# Patient Record
Sex: Male | Born: 1960 | Race: White | Hispanic: No | Marital: Married | Smoking: Never smoker
Health system: Southern US, Community
[De-identification: ages and names within clinical notes are randomized; demographics above are authoritative.]

## PROBLEM LIST (undated history)

## (undated) DIAGNOSIS — I10 Essential (primary) hypertension: Secondary | ICD-10-CM

## (undated) DIAGNOSIS — J40 Bronchitis, not specified as acute or chronic: Secondary | ICD-10-CM

## (undated) DIAGNOSIS — Z9889 Other specified postprocedural states: Secondary | ICD-10-CM

## (undated) DIAGNOSIS — I209 Angina pectoris, unspecified: Secondary | ICD-10-CM

## (undated) DIAGNOSIS — N401 Enlarged prostate with lower urinary tract symptoms: Secondary | ICD-10-CM

## (undated) DIAGNOSIS — G4730 Sleep apnea, unspecified: Secondary | ICD-10-CM

## (undated) DIAGNOSIS — Z8719 Personal history of other diseases of the digestive system: Secondary | ICD-10-CM

## (undated) DIAGNOSIS — E669 Obesity, unspecified: Secondary | ICD-10-CM

## (undated) DIAGNOSIS — J189 Pneumonia, unspecified organism: Secondary | ICD-10-CM

## (undated) DIAGNOSIS — N529 Male erectile dysfunction, unspecified: Secondary | ICD-10-CM

## (undated) DIAGNOSIS — K219 Gastro-esophageal reflux disease without esophagitis: Secondary | ICD-10-CM

## (undated) DIAGNOSIS — E785 Hyperlipidemia, unspecified: Secondary | ICD-10-CM

## (undated) DIAGNOSIS — L0291 Cutaneous abscess, unspecified: Secondary | ICD-10-CM

## (undated) DIAGNOSIS — IMO0001 Reserved for inherently not codable concepts without codable children: Secondary | ICD-10-CM

## (undated) DIAGNOSIS — M1990 Unspecified osteoarthritis, unspecified site: Secondary | ICD-10-CM

## (undated) DIAGNOSIS — N451 Epididymitis: Secondary | ICD-10-CM

## (undated) DIAGNOSIS — E119 Type 2 diabetes mellitus without complications: Secondary | ICD-10-CM

## (undated) DIAGNOSIS — Z8614 Personal history of Methicillin resistant Staphylococcus aureus infection: Secondary | ICD-10-CM

## (undated) DIAGNOSIS — K5900 Constipation, unspecified: Secondary | ICD-10-CM

## (undated) DIAGNOSIS — T8859XA Other complications of anesthesia, initial encounter: Secondary | ICD-10-CM

## (undated) DIAGNOSIS — R197 Diarrhea, unspecified: Secondary | ICD-10-CM

## (undated) DIAGNOSIS — R112 Nausea with vomiting, unspecified: Secondary | ICD-10-CM

## (undated) DIAGNOSIS — T4145XA Adverse effect of unspecified anesthetic, initial encounter: Secondary | ICD-10-CM

## (undated) HISTORY — DX: Obesity, unspecified: E66.9

## (undated) HISTORY — DX: Hyperlipidemia, unspecified: E78.5

## (undated) HISTORY — DX: Male erectile dysfunction, unspecified: N52.9

## (undated) HISTORY — DX: Type 2 diabetes mellitus without complications: E11.9

## (undated) HISTORY — DX: Essential (primary) hypertension: I10

## (undated) HISTORY — PX: PILONIDAL CYST EXCISION: SHX744

## (undated) HISTORY — DX: Cutaneous abscess, unspecified: L02.91

## (undated) HISTORY — PX: COLONOSCOPY: SHX174

## (undated) HISTORY — PX: INCISION AND DRAINAGE OF WOUND: SHX1803

---
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
## 2001-09-28 HISTORY — PX: FINGER FRACTURE SURGERY: SHX638

## 2001-10-01 ENCOUNTER — Encounter: Admission: RE | Admit: 2001-10-01 | Discharge: 2001-10-01 | Payer: Self-pay | Admitting: Occupational Medicine

## 2001-10-01 ENCOUNTER — Encounter: Payer: Self-pay | Admitting: Occupational Medicine

## 2003-07-17 HISTORY — PX: HERNIA REPAIR: SHX51

## 2004-01-26 ENCOUNTER — Ambulatory Visit (HOSPITAL_COMMUNITY): Admission: RE | Admit: 2004-01-26 | Discharge: 2004-01-26 | Payer: Self-pay | Admitting: General Surgery

## 2004-01-26 ENCOUNTER — Ambulatory Visit (HOSPITAL_BASED_OUTPATIENT_CLINIC_OR_DEPARTMENT_OTHER): Admission: RE | Admit: 2004-01-26 | Discharge: 2004-01-26 | Payer: Self-pay | Admitting: General Surgery

## 2004-01-26 HISTORY — PX: UMBILICAL HERNIA REPAIR: SHX196

## 2008-09-07 ENCOUNTER — Encounter (INDEPENDENT_AMBULATORY_CARE_PROVIDER_SITE_OTHER): Payer: Self-pay | Admitting: *Deleted

## 2008-09-07 ENCOUNTER — Ambulatory Visit (HOSPITAL_COMMUNITY): Admission: RE | Admit: 2008-09-07 | Discharge: 2008-09-07 | Payer: Self-pay | Admitting: *Deleted

## 2008-09-07 HISTORY — PX: STAPLE HEMORRHOIDECTOMY: SHX2438

## 2010-10-31 LAB — DIFFERENTIAL
Basophils Absolute: 0 10*3/uL (ref 0.0–0.1)
Basophils Relative: 0 % (ref 0–1)
Eosinophils Absolute: 0.1 10*3/uL (ref 0.0–0.7)
Eosinophils Relative: 1 % (ref 0–5)
Lymphocytes Relative: 30 % (ref 12–46)
Lymphs Abs: 3 10*3/uL (ref 0.7–4.0)
Monocytes Absolute: 0.6 10*3/uL (ref 0.1–1.0)
Monocytes Relative: 6 % (ref 3–12)
Neutro Abs: 6.3 10*3/uL (ref 1.7–7.7)
Neutrophils Relative %: 63 % (ref 43–77)

## 2010-10-31 LAB — BASIC METABOLIC PANEL
BUN: 9 mg/dL (ref 6–23)
CO2: 27 mEq/L (ref 19–32)
Calcium: 8.6 mg/dL (ref 8.4–10.5)
Chloride: 103 mEq/L (ref 96–112)
Creatinine, Ser: 0.82 mg/dL (ref 0.4–1.5)
GFR calc Af Amer: >60 mL/min (ref >60)
GFR calc non Af Amer: >60 mL/min (ref >60)
Glucose, Bld: 112 mg/dL — ABNORMAL HIGH (ref 70–99)
Potassium: 3.4 mEq/L — ABNORMAL LOW (ref 3.5–5.1)
Sodium: 139 mEq/L (ref 135–145)

## 2010-10-31 LAB — CBC
HCT: 44 % (ref 39.0–52.0)
Hemoglobin: 14.5 g/dL (ref 13.0–17.0)
MCHC: 32.9 g/dL (ref 30.0–36.0)
MCV: 86.6 fL (ref 78.0–100.0)
Platelets: 252 10*3/uL (ref 150–400)
RBC: 5.08 MIL/uL (ref 4.22–5.81)
RDW: 13.3 % (ref 11.5–15.5)
WBC: 10 10*3/uL (ref 4.0–10.5)

## 2010-11-28 NOTE — Op Note (Signed)
NAME:  Terry Wolfe, Terry Wolfe NO.:  [id number]   MEDICAL RECORD NO.:  [id number]          PATIENT TYPE:  AMB   LOCATION:  DAY                          FACILITY:  [facility]   PHYSICIAN:  Alfonse Ras, MD   DATE OF BIRTH:  08-Apr-1961   DATE OF PROCEDURE:  DATE OF DISCHARGE:                               OPERATIVE REPORT   ANESTHESIA:  General.   PREOPERATIVE DIAGNOSIS:  Grade 3 internal hemorrhoids with prolapse.   POSTOPERATIVE DIAGNOSIS:  Grade 3 internal hemorrhoids with prolapse.   PROCEDURE:  PPH hemorrhoidectomy.   DESCRIPTION:  After extensive informed consent was granted both in the  office and in the preoperative holding area, the patient was taken to  the operating room and placed in the supine position.  After adequate  general anesthesia with endotracheal tube, and then placed in the prone  jack-knife position.  Perianal and rectal prep were undertaken with  Betadine solution.  Digital rectal dilatation was accomplished to three  fingerbreadths.  Internal hemorrhoidal bundles which were quite inflamed  were injected with 0.5 Marcaine with Wydase.  The internal-external  sphincter muscles were injected with additional 0.5 Marcaine.  A 2-0  Prolene suture was placed in a pursestring fashion approximately 5 cm  proximal to the dentate line with mucosa and submucosa.  Stapler was  then introduced.  The pursestring was tied down around the anvil and was  closed and held in place for 45 seconds.  It was fired, released, and  removed.  There was a good donut of tissue with no evidence of  muscularis.  Staple line was inspected.  There was no bleeding.  Gelfoam  packing was placed.  The patient tolerated the procedure well and went  to PACU in good condition.      Alfonse Ras, MD  Electronically Signed     KRE/MEDQ  D:  09/07/2008  T:  09/07/2008  Job:  [id number]

## 2010-12-01 NOTE — Op Note (Signed)
NAME:  Terry Wolfe, Terry Wolfe NO.:  [id number]   MEDICAL RECORD NO.:  [id number]                   PATIENT TYPE:  AMB   LOCATION:  DSC                                  FACILITY:  [facility]   PHYSICIAN:  Gita Kudo, M.D.              DATE OF BIRTH:  September 11, 1960   DATE OF PROCEDURE:  01/26/2004  DATE OF DISCHARGE:                                 OPERATIVE REPORT   PROCEDURE:  Repair of umbilical hernia with Ventralex mesh - 2.5 inch  circle.   SURGEON:  Gita Kudo, M.D.   ANESTHESIA:  General.   PREOPERATIVE DIAGNOSIS:  Umbilical hernia.   POSTOPERATIVE DIAGNOSIS:  Umbilical hernia.   INDICATIONS FOR PROCEDURE:  This 50 year old heavy male with a bulge at his  umbilicus.  It worsened while he was taking care of his invalid wife, and  now he comes in for an elective surgery.   FINDINGS:  The patient had a thin walled hernia sac that had pre-peritoneal  fat, as well as omentum in it.  The fascia defect was about 2.0 cm in size.   DESCRIPTION OF PROCEDURE:  Under satisfactory general endotracheal  anesthesia having received 1.0 g Ancef preoperatively, the patient was  positioned, prepped and draped in a standard fashion.  A total of 30 mL of  0.25% Marcaine with epinephrine was infiltrated during the procedure for  postoperative analgesia.  A curved incision was made above the umbilicus and  carried down to the abdominal wall.  The hernia sac was dissected off the  umbilical skin, and then the fascial/hernia sac junction was circumscribed  using cautery.  The sac was then opened because it was so redundant and  excess cut away.  Finger dissection showed that we were in the peritoneum  and that there was good margin all around for mesh adherence.  Then the  circular portion of the mesh was prepared.  Sutures were placed at 12, 3 and  6 o'clock.  They were placed down through the abdominal wall and the  peritoneum through the polypropylene part of the  mesh and then back up  through.  The mesh was then placed into the defect, and the sutures brought  up and the mesh lay closely against the undersurface of the abdominal wall.  Then the last suture at the 9 o'clock position was placed from the opposite  side of the table in a similar fashion and tied.  In this manner the mesh  was in excellent position.  Then the defect was closed in a transverse direction with figure-of-eight #0  Prolene sutures x3, taking bites of the tabs of the mesh, to ensure good  adherence.  Then the wound was lavaged with saline, closed in layers with #2-  0 and  #3-0 Vicryl, followed by Steri-Strips for skin, and two mattress sutures of  #3-0 Vicryl.  A sterile absorbent dressing was  then applied.  The patient went to the recovery room from the operating room in good  condition, without complications.                                               Gita Kudo, M.D.    MRL/MEDQ  D:  01/26/2004  T:  01/26/2004  Job:  [id number]   cc:   Donia Guiles, M.D.  [address] [place]  [place] [address]  Fax: [phone]

## 2011-03-17 HISTORY — PX: INCISE AND DRAIN ABCESS: PRO64

## 2011-04-04 ENCOUNTER — Encounter (INDEPENDENT_AMBULATORY_CARE_PROVIDER_SITE_OTHER): Payer: Self-pay | Admitting: Surgery

## 2011-04-04 ENCOUNTER — Ambulatory Visit (INDEPENDENT_AMBULATORY_CARE_PROVIDER_SITE_OTHER): Payer: BC Managed Care – PPO | Admitting: Surgery

## 2011-04-04 VITALS — BP 148/88 | HR 72 | Temp 98.2°F | Resp 20 | Ht 71.0 in | Wt 276.2 lb

## 2011-04-04 DIAGNOSIS — L02419 Cutaneous abscess of limb, unspecified: Secondary | ICD-10-CM

## 2011-04-04 NOTE — Patient Instructions (Signed)
Change the outside dressing as needed but try to leave the packing in place. I will lead to see you on Friday to followup and will change the dressing then. If you think you are any worse tomorrow, call the office so we can follow you up. As we discussed, I think this is a MRSA infection and these can sometimes get much worse very quickly.

## 2011-04-04 NOTE — Progress Notes (Signed)
Chief complaint: Thigh abscess  History of present illness: The patient presents from his primary physician, Dr. Clelia Croft, with a left thigh abscess. He presented to to that office on September 17 with a four-day history of left thigh swelling worsening in size. Of note was that his father was recently treated for MRSA. At that time and I and D. was done and culture sent. There is no result of that culture and apparently a second one was sent today  The patient returned for followup today. He notes that he felt better on the lower half of his thigh area where the infection was after the I&D but the upper portion did not improve much. He has had no fevers or flulike symptoms. T. his physician it did not look that this did not in any better. He felt that it might be better with a bigger drainage and asked for surgical consultation.  Past history, family history, and review of systems are all in our Epic chart and not redictated here.  Physical exam: Vital signs noted in Epic. He is not febrile.  General: The patient alert and in no distress. He appears comfortable. Thigh: There is an open area on the lower anterior left thigh about 1.5 cm in length. There is some purulent material present. It is approximately 5 or 6 cm area of induration at about 12 cm area of erythema. There appears to be some fluctuance proximal to the I&D site.  Impression: I think this is a MRSA infection. I think some additional incision and drainage will be helpful although this might need to be done subsequently in the operating room if he does not show rapid improvement.  Plan I discussed the situation with the patient and he was agreeable to have [place] open this up more under local anesthesia in the office.  Procedure note: The anterior thighs prepped with Betadine. I anesthetized it with about 10 cc of Xylocaine with epi getting a fairly wide long area anesthetized. I then extended the prior incision proximally for about 6 cm. I  took a 1 cm wide area scan to be sure this stayed widely opened. I probed deeper to see if there were any pockets further in and couldn't identify any although there is a moderate amount of induration of the tissues. I couldn't probe any deeper because of inability to get anesthesia into these deeper tissues.  He tolerated this well. I packed it with some 4 x 4's. I'll plan to follow him up here on Friday, two days from now. I told him if this gets any worse or he develops any systemic symptoms then he needs to call his back or go to the emergency department. Although I think this local I&D will suffice to get some improvement, I told him that sometimes these progress and need to be treated with aggressive drainage in the operating room and intravenous antibiotics.  A repeat culture as noted was sent already by his primary physicians so I did not send another one today. He is started on doxycycline.

## 2011-04-06 ENCOUNTER — Ambulatory Visit (INDEPENDENT_AMBULATORY_CARE_PROVIDER_SITE_OTHER): Payer: BC Managed Care – PPO | Admitting: Surgery

## 2011-04-06 ENCOUNTER — Encounter (INDEPENDENT_AMBULATORY_CARE_PROVIDER_SITE_OTHER): Payer: Self-pay | Admitting: Surgery

## 2011-04-06 ENCOUNTER — Telehealth (INDEPENDENT_AMBULATORY_CARE_PROVIDER_SITE_OTHER): Payer: Self-pay | Admitting: General Surgery

## 2011-04-06 VITALS — BP 168/100 | HR 64 | Temp 97.6°F | Resp 12 | Ht 71.0 in | Wt 278.6 lb

## 2011-04-06 DIAGNOSIS — L02419 Cutaneous abscess of limb, unspecified: Secondary | ICD-10-CM

## 2011-04-06 DIAGNOSIS — L03119 Cellulitis of unspecified part of limb: Secondary | ICD-10-CM

## 2011-04-06 NOTE — Progress Notes (Signed)
Chief complaint: Followup after abscess drainage  History of present illness: He presented to my office two days ago with a abscess in the left anterior thigh which had been previously drained. However it was not improving and we opened it additionally on Wednesday.  He feels that he is improving. He is having less pain. There has not been much drainage. He has not had fever.  Exam: The wound is clean. There is markedly less induration and erythema. It is still tender.  Impression: Improving after drainage of thigh abscess likely a MRSA infection  Plan: He will continue his antibiotics. He will start to shower and repack with sterile gauze dressings. We'll follow up here next week.

## 2011-04-06 NOTE — Patient Instructions (Signed)
Tomorrow you may remove the dressing and shower. Then it needs to be dressed with some sterile gauze. You should shower and change dressing like this daily.  I will be to recheck you in the office next week. If you believe you're getting any worse she need to let us know and be seen in the emergency room over the weekend.

## 2011-04-06 NOTE — Telephone Encounter (Signed)
Patient out of town starting next Thursday, appt made for reck with Dr Jamey Ripa 04/11/11 @ 5:00 to arrive at 4:45pm.

## 2011-04-11 ENCOUNTER — Ambulatory Visit (INDEPENDENT_AMBULATORY_CARE_PROVIDER_SITE_OTHER): Payer: BC Managed Care – PPO | Admitting: Surgery

## 2011-04-11 ENCOUNTER — Encounter (INDEPENDENT_AMBULATORY_CARE_PROVIDER_SITE_OTHER): Payer: Self-pay | Admitting: Surgery

## 2011-04-11 VITALS — BP 130/88 | HR 80 | Temp 98.4°F | Resp 16 | Ht 71.0 in | Wt 282.2 lb

## 2011-04-11 DIAGNOSIS — L03119 Cellulitis of unspecified part of limb: Secondary | ICD-10-CM

## 2011-04-11 DIAGNOSIS — L02419 Cutaneous abscess of limb, unspecified: Secondary | ICD-10-CM

## 2011-04-11 MED ORDER — DOXYCYCLINE HYCLATE 100 MG PO CAPS: 100.0000 mg | ORAL_CAPSULE | Freq: Two times a day (BID) | ORAL | Status: DC

## 2011-04-11 NOTE — Progress Notes (Signed)
Chief complaint: Followup after abscess drainage  History of present illness: He had a partially opened abscess opened more extensively about 10 days ago. He has been following up here intermittently. Currently he has been showering but leaving the packing in the abscess cavity on the left thigh. He feels that he is improving. He is having less pain. There has not been much drainage. He has not had fever.  Exam: The wound is clean. There is markedly less induration and erythema. It is still tender but minimal  Impression: Improving after drainage of thigh abscess likely a MRSA infection  Plan: He will continue his antibiotics. He will start to shower and repack with sterile gauze dressings. We'll follow up here next week.

## 2011-04-11 NOTE — Patient Instructions (Signed)
Starting today or tomorrow, when you shower remove the packing. It should be gently replaced with a note her 2 x 2 gauze and then covered as you have been doing.  Continue your antibiotics for another week or 10 days. We need to have someone check the area in the office next week so we will make an appointment for that.

## 2011-04-12 ENCOUNTER — Encounter (INDEPENDENT_AMBULATORY_CARE_PROVIDER_SITE_OTHER): Payer: BC Managed Care – PPO | Admitting: Surgery

## 2011-04-17 ENCOUNTER — Encounter (INDEPENDENT_AMBULATORY_CARE_PROVIDER_SITE_OTHER): Payer: Self-pay | Admitting: Surgery

## 2011-04-19 ENCOUNTER — Ambulatory Visit (INDEPENDENT_AMBULATORY_CARE_PROVIDER_SITE_OTHER): Payer: BC Managed Care – PPO | Admitting: Surgery

## 2011-04-19 ENCOUNTER — Encounter (INDEPENDENT_AMBULATORY_CARE_PROVIDER_SITE_OTHER): Payer: Self-pay | Admitting: Surgery

## 2011-04-19 VITALS — BP 168/92 | HR 64 | Temp 97.7°F | Resp 20 | Ht 71.0 in | Wt 284.5 lb

## 2011-04-19 DIAGNOSIS — L02419 Cutaneous abscess of limb, unspecified: Secondary | ICD-10-CM

## 2011-04-19 NOTE — Progress Notes (Signed)
Chief complaint: Followup after abscess drainage  History of present illness: He had a partially opened abscess opened more extensively about 10 days ago. He has been following up here intermittently. Currently he has been showering but leaving the packing in the abscess cavity on the left thigh. He feels that he is improving. He is having less pain. There has not been much drainage. He has not had fever.  Exam: The wound is clean. There is markedly less induration and erythema. It is still tender but minimal  Impression: Improving after drainage of thigh abscess likely a MRSA infection  Plan: He will continue his antibiotics. He will start to shower and repack with sterile gauze dressings. We'll follow up here next week.   04/19/11 The wound is almost completely healed.  2x1.5 cm area of granulation tissue flush with the skin.  Will switch to Neosporin and dry gauze until completely healed.  Follow-up PRn

## 2011-04-19 NOTE — Patient Instructions (Signed)
Dress the wound with Neosporin and a dry dressing.  Change daily after showers.  Call us if any further problems [phone]

## 2011-11-15 ENCOUNTER — Other Ambulatory Visit (HOSPITAL_COMMUNITY): Payer: Self-pay | Admitting: Orthopaedic Surgery

## 2011-11-15 ENCOUNTER — Encounter (HOSPITAL_COMMUNITY)
Admission: AD | Disposition: A | Discharge status: Home / Self Care | Payer: Self-pay | Source: Ambulatory Visit | Attending: Orthopaedic Surgery

## 2011-11-15 ENCOUNTER — Ambulatory Visit (HOSPITAL_COMMUNITY)
Admission: AD | Admit: 2011-11-15 | Discharge: 2011-11-16 | Disposition: A | Discharge status: Home / Self Care | Payer: Worker's Compensation | Source: Ambulatory Visit | Attending: Orthopaedic Surgery | Admitting: Orthopaedic Surgery

## 2011-11-15 ENCOUNTER — Encounter (HOSPITAL_COMMUNITY): Payer: Self-pay | Admitting: *Deleted

## 2011-11-15 ENCOUNTER — Ambulatory Visit (HOSPITAL_COMMUNITY): Payer: Worker's Compensation | Admitting: Certified Registered Nurse Anesthetist

## 2011-11-15 ENCOUNTER — Ambulatory Visit (HOSPITAL_COMMUNITY): Payer: Worker's Compensation

## 2011-11-15 ENCOUNTER — Encounter (HOSPITAL_COMMUNITY): Payer: Self-pay | Admitting: Certified Registered Nurse Anesthetist

## 2011-11-15 DIAGNOSIS — E669 Obesity, unspecified: Secondary | ICD-10-CM | POA: Insufficient documentation

## 2011-11-15 DIAGNOSIS — L02512 Cutaneous abscess of left hand: Secondary | ICD-10-CM

## 2011-11-15 DIAGNOSIS — E785 Hyperlipidemia, unspecified: Secondary | ICD-10-CM | POA: Insufficient documentation

## 2011-11-15 DIAGNOSIS — I10 Essential (primary) hypertension: Secondary | ICD-10-CM | POA: Insufficient documentation

## 2011-11-15 DIAGNOSIS — F329 Major depressive disorder, single episode, unspecified: Secondary | ICD-10-CM | POA: Insufficient documentation

## 2011-11-15 DIAGNOSIS — F3289 Other specified depressive episodes: Secondary | ICD-10-CM | POA: Insufficient documentation

## 2011-11-15 DIAGNOSIS — IMO0002 Reserved for concepts with insufficient information to code with codable children: Secondary | ICD-10-CM | POA: Insufficient documentation

## 2011-11-15 DIAGNOSIS — L03012 Cellulitis of left finger: Secondary | ICD-10-CM

## 2011-11-15 HISTORY — DX: Benign prostatic hyperplasia with lower urinary tract symptoms: N40.1

## 2011-11-15 HISTORY — DX: Personal history of other diseases of the digestive system: Z87.19

## 2011-11-15 HISTORY — DX: Pneumonia, unspecified organism: J18.9

## 2011-11-15 HISTORY — DX: Angina pectoris, unspecified: I20.9

## 2011-11-15 HISTORY — DX: Gastro-esophageal reflux disease without esophagitis: K21.9

## 2011-11-15 HISTORY — DX: Personal history of Methicillin resistant Staphylococcus aureus infection: Z86.14

## 2011-11-15 HISTORY — DX: Epididymitis: N45.1

## 2011-11-15 HISTORY — PX: I & D EXTREMITY: SHX5045

## 2011-11-15 HISTORY — DX: Unspecified osteoarthritis, unspecified site: M19.90

## 2011-11-15 LAB — BASIC METABOLIC PANEL
BUN: 13 mg/dL (ref 6–23)
Calcium: 9.4 mg/dL (ref 8.4–10.5)
GFR calc Af Amer: >90 mL/min (ref >90)
GFR calc non Af Amer: >90 mL/min (ref >90)
Glucose, Bld: 80 mg/dL (ref 70–99)
Potassium: 3.4 mEq/L — ABNORMAL LOW (ref 3.5–5.1)
Sodium: 140 mEq/L (ref 135–145)

## 2011-11-15 LAB — CBC
Hemoglobin: 14.2 g/dL (ref 13.0–17.0)
MCH: 28.2 pg (ref 26.0–34.0)
MCHC: 33.6 g/dL (ref 30.0–36.0)
RDW: 13.3 % (ref 11.5–15.5)

## 2011-11-15 LAB — SURGICAL PCR SCREEN
MRSA, PCR: POSITIVE — AB
Staphylococcus aureus: POSITIVE — AB

## 2011-11-15 SURGERY — IRRIGATION AND DEBRIDEMENT EXTREMITY
Anesthesia: General | Site: Thumb | Laterality: Left | Wound class: Dirty or Infected

## 2011-11-15 MED ORDER — CLINDAMYCIN PHOSPHATE 600 MG/50ML IV SOLN
600.0000 mg | INTRAVENOUS | Status: AC
  Administered 2011-11-15: 600 mg via INTRAVENOUS
  Filled 2011-11-15: qty 50

## 2011-11-15 MED ORDER — MORPHINE SULFATE 4 MG/ML IJ SOLN: 0.0500 mg/kg | INTRAMUSCULAR | Status: DC | PRN

## 2011-11-15 MED ORDER — ONDANSETRON HCL 4 MG/2ML IJ SOLN: 4.0000 mg | Freq: Once | INTRAMUSCULAR | Status: DC | PRN

## 2011-11-15 MED ORDER — ONDANSETRON HCL 4 MG PO TABS: 4.0000 mg | ORAL_TABLET | Freq: Four times a day (QID) | ORAL | Status: DC | PRN

## 2011-11-15 MED ORDER — HYDROMORPHONE HCL PF 1 MG/ML IJ SOLN
INTRAMUSCULAR | Status: AC
  Administered 2011-11-15: 0.5 mg via INTRAVENOUS
  Filled 2011-11-15: qty 1

## 2011-11-15 MED ORDER — ASPIRIN EC 81 MG PO TBEC
81.0000 mg | DELAYED_RELEASE_TABLET | Freq: Every day | ORAL | Status: DC
  Administered 2011-11-16: 81 mg via ORAL
  Filled 2011-11-15: qty 1

## 2011-11-15 MED ORDER — SODIUM CHLORIDE 0.9 % IR SOLN
Status: DC | PRN
  Administered 2011-11-15: 1000 mL

## 2011-11-15 MED ORDER — ONDANSETRON HCL 4 MG/2ML IJ SOLN: 4.0000 mg | Freq: Four times a day (QID) | INTRAMUSCULAR | Status: DC | PRN

## 2011-11-15 MED ORDER — HYDROMORPHONE HCL PF 1 MG/ML IJ SOLN: 0.2500 mg | INTRAMUSCULAR | Status: DC | PRN

## 2011-11-15 MED ORDER — ALBUTEROL SULFATE (5 MG/ML) 0.5% IN NEBU
2.5000 mg | INHALATION_SOLUTION | Freq: Once | RESPIRATORY_TRACT | Status: AC
  Administered 2011-11-15: 2.5 mg via RESPIRATORY_TRACT

## 2011-11-15 MED ORDER — METOCLOPRAMIDE HCL 5 MG/ML IJ SOLN
5.0000 mg | Freq: Three times a day (TID) | INTRAMUSCULAR | Status: DC | PRN
  Filled 2011-11-15: qty 2

## 2011-11-15 MED ORDER — LISINOPRIL 20 MG PO TABS
20.0000 mg | ORAL_TABLET | Freq: Every day | ORAL | Status: DC
  Administered 2011-11-16: 20 mg via ORAL
  Filled 2011-11-15: qty 1

## 2011-11-15 MED ORDER — HYDROMORPHONE HCL PF 1 MG/ML IJ SOLN
0.2500 mg | INTRAMUSCULAR | Status: DC | PRN
  Administered 2011-11-15 (×4): 0.5 mg via INTRAVENOUS

## 2011-11-15 MED ORDER — METHOCARBAMOL 500 MG PO TABS
500.0000 mg | ORAL_TABLET | Freq: Four times a day (QID) | ORAL | Status: DC | PRN
  Administered 2011-11-16: 500 mg via ORAL
  Filled 2011-11-15: qty 1

## 2011-11-15 MED ORDER — MIDAZOLAM HCL 5 MG/5ML IJ SOLN
INTRAMUSCULAR | Status: DC | PRN
  Administered 2011-11-15: 2 mg via INTRAVENOUS

## 2011-11-15 MED ORDER — AMLODIPINE BESYLATE 10 MG PO TABS
10.0000 mg | ORAL_TABLET | Freq: Every day | ORAL | Status: DC
  Administered 2011-11-16: 10 mg via ORAL
  Filled 2011-11-15: qty 1

## 2011-11-15 MED ORDER — LACTATED RINGERS IV SOLN: INTRAVENOUS | Status: DC

## 2011-11-15 MED ORDER — LIDOCAINE HCL (CARDIAC) 20 MG/ML IV SOLN
INTRAVENOUS | Status: DC | PRN
  Administered 2011-11-15: 60 mg via INTRAVENOUS

## 2011-11-15 MED ORDER — ONDANSETRON HCL 4 MG/2ML IJ SOLN
INTRAMUSCULAR | Status: DC | PRN
  Administered 2011-11-15: 4 mg via INTRAVENOUS

## 2011-11-15 MED ORDER — ADULT MULTIVITAMIN W/MINERALS CH
1.0000 | ORAL_TABLET | Freq: Every day | ORAL | Status: DC
  Administered 2011-11-16: 1 via ORAL
  Filled 2011-11-15: qty 1

## 2011-11-15 MED ORDER — CLINDAMYCIN PHOSPHATE 600 MG/50ML IV SOLN
600.0000 mg | Freq: Four times a day (QID) | INTRAVENOUS | Status: DC
  Administered 2011-11-16 (×2): 600 mg via INTRAVENOUS
  Filled 2011-11-15 (×3): qty 50

## 2011-11-15 MED ORDER — METOCLOPRAMIDE HCL 5 MG PO TABS
5.0000 mg | ORAL_TABLET | Freq: Three times a day (TID) | ORAL | Status: DC | PRN
  Filled 2011-11-15: qty 2

## 2011-11-15 MED ORDER — PROPOFOL 10 MG/ML IV EMUL
INTRAVENOUS | Status: DC | PRN
  Administered 2011-11-15: 200 mg via INTRAVENOUS

## 2011-11-15 MED ORDER — HYDROCODONE-ACETAMINOPHEN 5-325 MG PO TABS
1.0000 | ORAL_TABLET | ORAL | Status: DC | PRN
  Administered 2011-11-16: 2 via ORAL
  Filled 2011-11-15: qty 2

## 2011-11-15 MED ORDER — ATORVASTATIN CALCIUM 20 MG PO TABS
20.0000 mg | ORAL_TABLET | Freq: Every day | ORAL | Status: DC
  Filled 2011-11-15: qty 1

## 2011-11-15 MED ORDER — BUPIVACAINE HCL 0.25 % IJ SOLN
INTRAMUSCULAR | Status: DC | PRN
  Administered 2011-11-15: 5 mL

## 2011-11-15 MED ORDER — OXYCODONE HCL 5 MG PO TABS
5.0000 mg | ORAL_TABLET | ORAL | Status: DC | PRN
  Administered 2011-11-16: 10 mg via ORAL
  Filled 2011-11-15: qty 2

## 2011-11-15 MED ORDER — MUPIROCIN 2 % EX OINT
TOPICAL_OINTMENT | Freq: Two times a day (BID) | CUTANEOUS | Status: DC
  Filled 2011-11-15 (×2): qty 22

## 2011-11-15 MED ORDER — METHOCARBAMOL 100 MG/ML IJ SOLN: 500.0000 mg | Freq: Four times a day (QID) | INTRAVENOUS | Status: DC | PRN

## 2011-11-15 MED ORDER — SODIUM CHLORIDE 0.9 % IV SOLN
INTRAVENOUS | Status: DC
  Administered 2011-11-16: 02:00:00 via INTRAVENOUS

## 2011-11-15 MED ORDER — ZOLPIDEM TARTRATE 5 MG PO TABS: 5.0000 mg | ORAL_TABLET | Freq: Every evening | ORAL | Status: DC | PRN

## 2011-11-15 MED ORDER — LACTATED RINGERS IV SOLN
INTRAVENOUS | Status: DC | PRN
  Administered 2011-11-15: 18:00:00 via INTRAVENOUS

## 2011-11-15 MED ORDER — FENTANYL CITRATE 0.05 MG/ML IJ SOLN
INTRAMUSCULAR | Status: DC | PRN
  Administered 2011-11-15 (×2): 25 ug via INTRAVENOUS
  Administered 2011-11-15: 50 ug via INTRAVENOUS

## 2011-11-15 MED ORDER — DIPHENHYDRAMINE HCL 12.5 MG/5ML PO ELIX
12.5000 mg | ORAL_SOLUTION | ORAL | Status: DC | PRN
  Filled 2011-11-15: qty 10

## 2011-11-15 MED ORDER — MORPHINE SULFATE 2 MG/ML IJ SOLN
1.0000 mg | INTRAMUSCULAR | Status: DC | PRN
  Administered 2011-11-16: 1 mg via INTRAVENOUS
  Filled 2011-11-15: qty 1

## 2011-11-15 SURGICAL SUPPLY — 55 items
BANDAGE CONFORM 2  STR LF (GAUZE/BANDAGES/DRESSINGS) ×2 IMPLANT
BANDAGE CONFORM 3  STR LF (GAUZE/BANDAGES/DRESSINGS) ×2 IMPLANT
BANDAGE ELASTIC 3 VELCRO ST LF (GAUZE/BANDAGES/DRESSINGS) IMPLANT
BLADE SURG 10 STRL SS (BLADE) ×2 IMPLANT
BNDG COHESIVE 1X5 TAN STRL LF (GAUZE/BANDAGES/DRESSINGS) ×2 IMPLANT
BNDG COHESIVE 4X5 TAN STRL (GAUZE/BANDAGES/DRESSINGS) ×2 IMPLANT
BNDG COHESIVE 6X5 TAN STRL LF (GAUZE/BANDAGES/DRESSINGS) ×4 IMPLANT
BNDG GAUZE STRTCH 6 (GAUZE/BANDAGES/DRESSINGS) ×6 IMPLANT
CLOTH BEACON ORANGE TIMEOUT ST (SAFETY) ×2 IMPLANT
CORDS BIPOLAR (ELECTRODE) IMPLANT
COVER SURGICAL LIGHT HANDLE (MISCELLANEOUS) ×2 IMPLANT
CUFF TOURNIQUET SINGLE 18IN (TOURNIQUET CUFF) ×2 IMPLANT
CUFF TOURNIQUET SINGLE 24IN (TOURNIQUET CUFF) IMPLANT
CUFF TOURNIQUET SINGLE 34IN LL (TOURNIQUET CUFF) IMPLANT
CUFF TOURNIQUET SINGLE 44IN (TOURNIQUET CUFF) IMPLANT
DRAPE ORTHO SPLIT 77X108 STRL (DRAPES)
DRAPE SURG 17X23 STRL (DRAPES) ×2 IMPLANT
DRAPE SURG ORHT 6 SPLT 77X108 (DRAPES) IMPLANT
DRAPE U-SHAPE 47X51 STRL (DRAPES) IMPLANT
DURAPREP 26ML APPLICATOR (WOUND CARE) ×2 IMPLANT
ELECT CAUTERY BLADE 6.4 (BLADE) ×2 IMPLANT
ELECT REM PT RETURN 9FT ADLT (ELECTROSURGICAL) ×2
ELECTRODE REM PT RTRN 9FT ADLT (ELECTROSURGICAL) ×1 IMPLANT
GAUZE XEROFORM 1X8 LF (GAUZE/BANDAGES/DRESSINGS) ×2 IMPLANT
GLOVE BIOGEL PI IND STRL 8 (GLOVE) ×2 IMPLANT
GLOVE BIOGEL PI INDICATOR 8 (GLOVE) ×2
GLOVE ORTHO TXT STRL SZ7.5 (GLOVE) ×2 IMPLANT
GOWN PREVENTION PLUS LG XLONG (DISPOSABLE) ×2 IMPLANT
GOWN PREVENTION PLUS XLARGE (GOWN DISPOSABLE) ×2 IMPLANT
GOWN STRL NON-REIN LRG LVL3 (GOWN DISPOSABLE) ×2 IMPLANT
HANDPIECE INTERPULSE COAX TIP (DISPOSABLE)
KIT BASIN OR (CUSTOM PROCEDURE TRAY) ×2 IMPLANT
KIT ROOM TURNOVER OR (KITS) ×2 IMPLANT
MANIFOLD NEPTUNE II (INSTRUMENTS) ×2 IMPLANT
NS IRRIG 1000ML POUR BTL (IV SOLUTION) ×2 IMPLANT
PACK ORTHO EXTREMITY (CUSTOM PROCEDURE TRAY) ×2 IMPLANT
PAD ARMBOARD 7.5X6 YLW CONV (MISCELLANEOUS) ×4 IMPLANT
PADDING CAST ABS 4INX4YD NS (CAST SUPPLIES) ×2
PADDING CAST ABS COTTON 4X4 ST (CAST SUPPLIES) ×2 IMPLANT
PADDING CAST COTTON 6X4 STRL (CAST SUPPLIES) ×2 IMPLANT
SET HNDPC FAN SPRY TIP SCT (DISPOSABLE) IMPLANT
SPONGE GAUZE 4X4 12PLY (GAUZE/BANDAGES/DRESSINGS) ×2 IMPLANT
SPONGE LAP 18X18 X RAY DECT (DISPOSABLE) ×2 IMPLANT
STOCKINETTE IMPERVIOUS 9X36 MD (GAUZE/BANDAGES/DRESSINGS) ×2 IMPLANT
SUT ETHILON 2 0 FS 18 (SUTURE) IMPLANT
SUT ETHILON 3 0 PS 1 (SUTURE) ×2 IMPLANT
SYR CONTROL 10ML LL (SYRINGE) IMPLANT
TOWEL OR 17X24 6PK STRL BLUE (TOWEL DISPOSABLE) ×2 IMPLANT
TOWEL OR 17X26 10 PK STRL BLUE (TOWEL DISPOSABLE) ×2 IMPLANT
TUBE ANAEROBIC SPECIMEN COL (MISCELLANEOUS) IMPLANT
TUBE CONNECTING 12X1/4 (SUCTIONS) ×2 IMPLANT
TUBE FEEDING 5FR 15 INCH (TUBING) IMPLANT
UNDERPAD 30X30 INCONTINENT (UNDERPADS AND DIAPERS) ×2 IMPLANT
WATER STERILE IRR 1000ML POUR (IV SOLUTION) IMPLANT
YANKAUER SUCT BULB TIP NO VENT (SUCTIONS) ×2 IMPLANT

## 2011-11-15 NOTE — Progress Notes (Signed)
Call to [facility] to report chest xray and ekg results.  New order to send to telemetry unit.

## 2011-11-15 NOTE — Anesthesia Procedure Notes (Signed)
Procedure Name: LMA Insertion Date/Time: 11/15/2011 7:08 PM Performed by: Rogelia Boga Pre-anesthesia Checklist: Patient identified, Emergency Drugs available, Suction available, Patient being monitored and Timeout performed Patient Re-evaluated:Patient Re-evaluated prior to inductionOxygen Delivery Method: Circle system utilized Preoxygenation: Pre-oxygenation with 100% oxygen Intubation Type: IV induction LMA: LMA with gastric port inserted LMA Size: 5.0 Number of attempts: 1 Placement Confirmation: positive ETCO2 and breath sounds checked- equal and bilateral Tube secured with: Tape Dental Injury: Teeth and Oropharynx as per pre-operative assessment

## 2011-11-15 NOTE — Brief Op Note (Signed)
11/15/2011  7:37 PM  PATIENT:  Terry Wolfe  51 y.o. male  PRE-OPERATIVE DIAGNOSIS:  Left thumb infection  POST-OPERATIVE DIAGNOSIS:  Left thumb infection  PROCEDURE:  Procedure(s) (LRB): IRRIGATION AND DEBRIDEMENT EXTREMITY (Left)  SURGEON:  Surgeon(s) and Role:    * Kathryne Hitch, MD - Primary  PHYSICIAN ASSISTANT:   ASSISTANTS: none   ANESTHESIA:   local and general  EBL:   minimal  BLOOD ADMINISTERED:none  DRAINS: none   LOCAL MEDICATIONS USED:  MARCAINE     SPECIMEN:  No Specimen  DISPOSITION OF SPECIMEN:  N/A  COUNTS:  YES  TOURNIQUET:  * Missing tourniquet times found for documented tourniquets in log:  19147 *  DICTATION: .Other Dictation: Dictation Number [id number]  PLAN OF CARE: Admit for overnight observation  PATIENT DISPOSITION:  PACU - hemodynamically stable.   Delay start of Pharmacological VTE agent (>24hrs) due to surgical blood loss or risk of bleeding: not applicable

## 2011-11-15 NOTE — Anesthesia Preprocedure Evaluation (Addendum)
Anesthesia Evaluation  Patient identified by MRN, date of birth, ID band Patient awake    Reviewed: Allergy & Precautions, H&P , NPO status , Patient's Chart, lab work & pertinent test results  Airway Mallampati: I TM Distance: >3 FB   Mouth opening: Limited Mouth Opening  Dental  (+) Partial Upper, Dental Advisory Given and Teeth Intact   Pulmonary neg pulmonary ROS,  breath sounds clear to auscultation        Cardiovascular hypertension, Pt. on medications Rhythm:Regular Rate:Normal     Neuro/Psych Depression    GI/Hepatic Neg liver ROS,   Endo/Other  Morbid obesity  Renal/GU negative Renal ROS     Musculoskeletal   Abdominal   Peds  Hematology   Anesthesia Other Findings   Reproductive/Obstetrics                        Anesthesia Physical Anesthesia Plan  ASA: II  Anesthesia Plan: General   Post-op Pain Management:    Induction: Intravenous  Airway Management Planned: LMA  Additional Equipment:   Intra-op Plan:   Post-operative Plan: Extubation in OR  Informed Consent: I have reviewed the patients History and Physical, chart, labs and discussed the procedure including the risks, benefits and alternatives for the proposed anesthesia with the patient or authorized representative who has indicated his/her understanding and acceptance.   Dental advisory given  Plan Discussed with: CRNA, Anesthesiologist and Surgeon  Anesthesia Plan Comments:         Anesthesia Quick Evaluation

## 2011-11-15 NOTE — Anesthesia Postprocedure Evaluation (Signed)
  Anesthesia Post-op Note  Patient: Terry Wolfe  Procedure(s) Performed: Procedure(s) (LRB): IRRIGATION AND DEBRIDEMENT EXTREMITY (Left)  Patient Location: PACU  Anesthesia Type: General  Level of Consciousness: awake  Airway and Oxygen Therapy: Patient Spontanous Breathing  Post-op Pain: mild  Post-op Assessment: Post-op Vital signs reviewed  Post-op Vital Signs: Reviewed  Complications: No apparent anesthesia complications

## 2011-11-15 NOTE — Progress Notes (Signed)
Call to dr Magnus Ivan to report left lung pain and dr Randa Evens ordering chest xray, ekg, and albuterol treatment.  No new orders.

## 2011-11-15 NOTE — H&P (Signed)
Terry Wolfe is an 51 y.o. male.   Chief Complaint:   Left thumb wound infection HPI:   51 yo male who had a splinter removed from his left thumb tip about 2 weeks ago by urgent care center.  Presented there today with active infection of his left thumb.  This needs a surgical I&D.  He understands the risks and benefits involved.  Past Medical History  Diagnosis Date  . Hypertension   . Hyperlipidemia   . Obesity   . ED (erectile dysfunction)   . Abscess     left thigh    Past Surgical History  Procedure Date  . Umbilical hernia repair 01/26/2004    Ventralex - Dr Maryagnes Amos  . Staple hemorrhoidectomy 09/07/2008    [facility] Dr Colin Benton  . Incise and drain abcess 03/2011    left thigh  . Hernia repair 2006  . Appendectomy   . Finger surgery   . Hemorroidectomy   . Tracheostomy     trach for 4 days as child for accident    No family history on file. Social History:  reports that he has never smoked. He does not have any smokeless tobacco history on file. He reports that he does not drink alcohol or use illicit drugs.  Allergies: No Known Allergies  Medications Prior to Admission  Medication Sig Dispense Refill  . amLODipine (NORVASC) 10 MG tablet Take 10 mg by mouth daily.      Marland Kitchen aspirin EC 81 MG tablet Take 81 mg by mouth daily.      Marland Kitchen glucosamine-chondroitin 500-400 MG tablet Take 1 tablet by mouth 3 (three) times daily.        Marland Kitchen lisinopril (PRINIVIL,ZESTRIL) 20 MG tablet Take 20 mg by mouth daily.      . Multiple Vitamin (MULITIVITAMIN WITH MINERALS) TABS Take 1 tablet by mouth daily.      . rosuvastatin (CRESTOR) 10 MG tablet Take 10 mg by mouth daily.      . sildenafil (VIAGRA) 50 MG tablet Take 50 mg by mouth as needed. For erectile dysfunction      . Tamsulosin HCl (FLOMAX) 0.4 MG CAPS Take 0.4 mg by mouth daily as needed. For urges to urinate        Results for orders placed during the hospital encounter of 11/15/11 (from the past 48 hour(s))  CBC     Status: Abnormal   Collection Time   11/15/11  3:25 PM      Component Value Range Comment   WBC 12.0 (*) 4.0 - 10.5 (K/uL)    RBC 5.04  4.22 - 5.81 (MIL/uL)    Hemoglobin 14.2  13.0 - 17.0 (g/dL)    HCT 82.9  56.2 - 13.0 (%)    MCV 83.7  78.0 - 100.0 (fL)    MCH 28.2  26.0 - 34.0 (pg)    MCHC 33.6  30.0 - 36.0 (g/dL)    RDW 86.5  78.4 - 69.6 (%)    Platelets 322  150 - 400 (K/uL)   BASIC METABOLIC PANEL     Status: Abnormal   Collection Time   11/15/11  3:25 PM      Component Value Range Comment   Sodium 140  135 - 145 (mEq/L)    Potassium 3.4 (*) 3.5 - 5.1 (mEq/L)    Chloride 102  96 - 112 (mEq/L)    CO2 30  19 - 32 (mEq/L)    Glucose, Bld 80  70 - 99 (mg/dL)  BUN 13  6 - 23 (mg/dL)    Creatinine, Ser 1.61  0.50 - 1.35 (mg/dL)    Calcium 9.4  8.4 - 10.5 (mg/dL)    GFR calc non Af Amer >90  >90 (mL/min)    GFR calc Af Amer >90  >90 (mL/min)    Dg Chest 2 View  11/15/2011  *RADIOLOGY REPORT*  Clinical Data: Preop  CHEST - 2 VIEW  Comparison: None.  Findings: Low volumes.  Clear lungs.  No pneumothorax.  No pleural effusion. Cardiac silhouette is prominent secondary low volumes.  IMPRESSION: No active cardiopulmonary disease.  Original Report Authenticated By: Donavan Burnet, M.D.    Review of Systems  All other systems reviewed and are negative.    Blood pressure 135/78, pulse 64, temperature 97.2 F (36.2 C), temperature source Oral, resp. rate 16, height 5\' 11"  (1.803 m), weight 131.997 kg (291 lb), SpO2 97.00%. Physical Exam  Constitutional: He is oriented to person, place, and time. He appears well-developed and well-nourished.  HENT:  Head: Normocephalic and atraumatic.  Eyes: EOM are normal. Pupils are equal, round, and reactive to light.  Neck: Normal range of motion. Neck supple.  Cardiovascular: Normal rate and regular rhythm.   Respiratory: Effort normal and breath sounds normal.  GI: Soft. Bowel sounds are normal.  Musculoskeletal:       Hands: Neurological: He is alert and  oriented to person, place, and time.  Skin: Skin is warm and dry.  Psychiatric: He has a normal mood and affect.     Assessment/Plan To the OR for a left thumb I&D.  Kathryne Hitch 11/15/2011, 5:33 PM

## 2011-11-15 NOTE — Op Note (Signed)
NAME:  Terry Wolfe, Terry Wolfe NO.:  [id number]  MEDICAL RECORD NO.:  [id number]  LOCATION:  [facility]                         FACILITY:  [facility]  PHYSICIAN:  Vanita Panda. Magnus Ivan, M.D.DATE OF BIRTH:  [birth year]  DATE OF PROCEDURE:  11/15/2011 DATE OF DISCHARGE:                              OPERATIVE REPORT   PREOPERATIVE DIAGNOSIS:  Left thumb extensive felon infection.  POSTOPERATIVE DIAGNOSIS:  Left thumb extensive felon infection.  PROCEDURE:  Irrigation and debridement of left thumb volar superficial and deep tissues.  FINDINGS:  Gross purulence in the superficial and deep tissues of the volar surface of left thumb.  SURGEON:  Doneen Poisson M.D.  ANESTHESIA: 1. General. 2. Local digital block with 4% plain Sensorcaine.  BLOOD LOSS:  Minimal.  COMPLICATION:  None.  INDICATIONS:  Ms. Dusza is a 51 year old gentleman who a couple weeks ago injured his left thumb.  I believe this is on the job and he attained the splint on his thumb.  This was eventually somehow removed and he was started on antibiotics, but continued to have infection in the thumb.  He does have a history of MRSA as well.  He presented today to the urgent care and was found to have gross purulence coming out of his thumb when they made a small wound and sent to our office.  I found he had a extensive felon and recommended an open irrigation and debridement with IV antibiotics as well.  He understands the risks and benefits of this and does wish to proceed with surgery.  PROCEDURE:  After informed consent was obtained, the appropriate left thumb was marked.  He was brought to the operating room and placed supine on the operating table with the left arm on the arm table. General anesthesia was then obtained.  His left hand and wrist were prepped and draped with DuraPrep and sterile drapes.  A time-out was called and he was identified as the correct patient and correct left thumb.  I  then made incision longitudinally directly over the pulp of the thumb, carrying this slightly proximally.  I encountered gross purulence.  I was able to open up widely and removed the necrotic superficial and deep tissue sharply with a knife with protecting neurovascular bundles.  Once adequately clean the wound, I irrigated with several liters of normal saline solution.  I then loosely reapproximated the incision with interrupted nylon suture with still leaving a little opening Xeroform, followed well-padded sterile dressings applied.  I did place a 0.25% Marcaine digital block as well. Thumb remained well perfused during the case.  He was awakened, extubated, and taken to recovery room in stable condition.  All final counts were correct.  There were no complications noted.  I will admit him overnight for IV antibiotics with discharge tomorrow.     Vanita Panda. Magnus Ivan, M.D.     CYB/MEDQ  D:  11/15/2011  T:  11/15/2011  Job:  [id number]

## 2011-11-15 NOTE — Transfer of Care (Signed)
Immediate Anesthesia Transfer of Care Note  Patient: Terry Wolfe  Procedure(s) Performed: Procedure(s) (LRB): IRRIGATION AND DEBRIDEMENT EXTREMITY (Left)  Patient Location: PACU  Anesthesia Type: General  Level of Consciousness: awake, alert , oriented and patient cooperative  Airway & Oxygen Therapy: Patient Spontanous Breathing and Patient connected to nasal cannula oxygen  Post-op Assessment: Report given to PACU RN, Post -op Vital signs reviewed and stable and Patient moving all extremities X 4  Post vital signs: Reviewed and stable  Complications: No apparent anesthesia complications

## 2011-11-16 ENCOUNTER — Encounter (HOSPITAL_COMMUNITY): Payer: Self-pay | Admitting: General Practice

## 2011-11-16 LAB — CARDIAC PANEL(CRET KIN+CKTOT+MB+TROPI)
CK, MB: 1.9 ng/mL (ref 0.3–4.0)
Relative Index: 1.6 (ref 0.0–2.5)
Relative Index: 1.5 (ref 0.0–2.5)
Total CK: 123 U/L (ref 7–232)
Troponin I: <0.3 ng/mL (ref <0.30)

## 2011-11-16 MED ORDER — CHLORHEXIDINE GLUCONATE CLOTH 2 % EX PADS: 6.0000 | MEDICATED_PAD | Freq: Every day | CUTANEOUS | Status: DC

## 2011-11-16 MED ORDER — DOXYCYCLINE HYCLATE 100 MG PO TABS: 100.0000 mg | ORAL_TABLET | Freq: Two times a day (BID) | ORAL | Status: AC

## 2011-11-16 MED ORDER — MUPIROCIN 2 % EX OINT
1.0000 "application " | TOPICAL_OINTMENT | Freq: Two times a day (BID) | CUTANEOUS | Status: DC
  Administered 2011-11-16: 1 via NASAL
  Filled 2011-11-16: qty 22

## 2011-11-16 MED ORDER — PANTOPRAZOLE SODIUM 40 MG PO TBEC
40.0000 mg | DELAYED_RELEASE_TABLET | Freq: Once | ORAL | Status: AC
  Administered 2011-11-16: 40 mg via ORAL
  Filled 2011-11-16: qty 1

## 2011-11-16 MED ORDER — SILVER SULFADIAZINE 1 % EX CREA: TOPICAL_CREAM | Freq: Every day | CUTANEOUS | Status: AC

## 2011-11-16 MED ORDER — HYDROCODONE-ACETAMINOPHEN 5-325 MG PO TABS: 1.0000 | ORAL_TABLET | ORAL | Status: AC | PRN

## 2011-11-16 NOTE — Progress Notes (Signed)
Pt arrived to floor c/o Chest pressure 5/10 worse with arm movement and thumb pain 6/10. MD notified, new orders received and will carry out. MD did not want EKG at this time, EKG was done in PACU. Will continue to monitor.

## 2011-11-16 NOTE — Progress Notes (Signed)
Pt states "im feeling much better the pain is going away" after protonix and Robaxin given. Will continue to monitor.

## 2011-11-16 NOTE — Discharge Summary (Signed)
Patient ID: Terry Wolfe MRN: [id number] DOB/AGE: Nov 20, 1960 51 y.o.  Admit date: 11/15/2011 Discharge date: 11/16/2011  Admission Diagnoses:  Principal Problem:  *Felon, left   Discharge Diagnoses:  Same  Past Medical History  Diagnosis Date  . Hypertension   . Hyperlipidemia   . Obesity   . ED (erectile dysfunction)   . Abscess     left thigh  . Epididymitis, right ~ 2009  . Angina   . History of MRSA infection 03/2011; 11/15/11    left thigh; nasal swab  . Chronic bronchitis     "as a child"  . Pneumonia     "chronic as a child  . GERD (gastroesophageal reflux disease)   . H/O hiatal hernia   . Arthritis     "right knuckles"  . Enlarged prostate with lower urinary tract symptoms (LUTS)     "that's what gave me epididymitis"    Surgeries: Procedure(s): IRRIGATION AND DEBRIDEMENT EXTREMITY on 11/15/2011   Consultants:    Discharged Condition: Improved  Hospital Course: Terry Wolfe is an 51 y.o. male who was admitted 11/15/2011 for operative treatment ofFelon, left. Patient has severe unremitting pain that affects sleep, daily activities, and work/hobbies. After pre-op clearance the patient was taken to the operating room on 11/15/2011 and underwent  Procedure(s): IRRIGATION AND DEBRIDEMENT EXTREMITY.    Patient was given perioperative antibiotics: Anti-infectives     Start     Dose/Rate Route Frequency Ordered Stop   11/16/11 0200   clindamycin (CLEOCIN) IVPB 600 mg        600 mg 100 mL/hr over 30 Minutes Intravenous Every 6 hours 11/15/11 2340 11/16/11 1959   11/16/11 0000   doxycycline (VIBRA-TABS) 100 MG tablet        100 mg Oral 2 times daily 11/16/11 0653 11/26/11 2359   11/15/11 1531   clindamycin (CLEOCIN) IVPB 600 mg        600 mg 100 mL/hr over 30 Minutes Intravenous 60 min pre-op 11/15/11 1531 11/15/11 1843           Patient was given sequential compression devices, early ambulation, and chemoprophylaxis to prevent DVT.  Patient benefited  maximally from hospital stay and there were no complications.    Recent vital signs: Patient Vitals for the past 24 hrs:  BP Temp Temp src Pulse Resp SpO2 Height Weight  11/16/11 0500 127/79 mmHg 97.9 F (36.6 C) - 62  18  94 % - -  11/15/11 2341 - - - - - 99 % - -  11/15/11 2330 129/77 mmHg 98 F (36.7 C) - 58  16  91 % 5\' 11"  (1.803 m) 131.1 kg (289 lb 0.4 oz)  11/15/11 2323 - - - 59  15  99 % - -  11/15/11 1945 136/83 mmHg - - 70  21  94 % - -  11/15/11 1941 - 97.6 F (36.4 C) - - - - - -  11/15/11 1516 135/78 mmHg 97.2 F (36.2 C) Oral 64  16  97 % 5\' 11"  (1.803 m) 131.997 kg (291 lb)     Recent laboratory studies:  Basename 11/15/11 1525  WBC 12.0*  HGB 14.2  HCT 42.2  PLT 322  NA 140  K 3.4*  CL 102  CO2 30  BUN 13  CREATININE 0.84  GLUCOSE 80  INR --  CALCIUM 9.4     Discharge Medications:   Medication List  As of 11/16/2011  6:53 AM   TAKE these medications  amLODipine 10 MG tablet   Commonly known as: NORVASC   Take 10 mg by mouth daily.      aspirin EC 81 MG tablet   Take 81 mg by mouth daily.      doxycycline 100 MG tablet   Commonly known as: VIBRA-TABS   Take 1 tablet (100 mg total) by mouth 2 (two) times daily.      glucosamine-chondroitin 500-400 MG tablet   Take 1 tablet by mouth 3 (three) times daily.      HYDROcodone-acetaminophen 5-325 MG per tablet   Commonly known as: NORCO   Take 1-2 tablets by mouth every 4 (four) hours as needed for pain.      lisinopril 20 MG tablet   Commonly known as: PRINIVIL,ZESTRIL   Take 20 mg by mouth daily.      mulitivitamin with minerals Tabs   Take 1 tablet by mouth daily.      rosuvastatin 10 MG tablet   Commonly known as: CRESTOR   Take 10 mg by mouth daily.      sildenafil 50 MG tablet   Commonly known as: VIAGRA   Take 50 mg by mouth as needed. For erectile dysfunction      silver sulfADIAZINE 1 % cream   Commonly known as: SILVADENE   Apply topically daily. Apply small amount to  wound daily      Tamsulosin HCl 0.4 MG Caps   Commonly known as: FLOMAX   Take 0.4 mg by mouth daily as needed. For urges to urinate            Diagnostic Studies: Dg Chest 2 View  11/15/2011  *RADIOLOGY REPORT*  Clinical Data: Preop  CHEST - 2 VIEW  Comparison: None.  Findings: Low volumes.  Clear lungs.  No pneumothorax.  No pleural effusion. Cardiac silhouette is prominent secondary low volumes.  IMPRESSION: No active cardiopulmonary disease.  Original Report Authenticated By: Donavan Burnet, M.D.   Dg Chest Port 1 View  11/15/2011  *RADIOLOGY REPORT*  Clinical Data: Chest pain.  PORTABLE CHEST - 1 VIEW  Comparison: 11/15/2011.  Findings: The heart is enlarged but stable.  Very low lung volumes with vascular crowding and atelectasis.  No edema, infiltrates or pneumothorax.  IMPRESSION: Cardiac enlargement and mild central vascular congestion.  No edema or effusions. Low lung volumes with vascular crowding and atelectasis.  Original Report Authenticated By: P. Loralie Champagne, M.D.    Disposition: discharge to home  Discharge Orders    Future Orders Please Complete By Expires   Diet - low sodium heart healthy      Call MD / Call 911      Comments:   If you experience chest pain or shortness of breath, CALL 911 and be transported to the hospital emergency room.  If you develope a fever above 101 F, pus (white drainage) or increased drainage or redness at the wound, or calf pain, call your surgeon's office.   Constipation Prevention      Comments:   Drink plenty of fluids.  Prune juice may be helpful.  You may use a stool softener, such as Colace (over the counter) 100 mg twice a day.  Use MiraLax (over the counter) for constipation as needed.   Increase activity slowly as tolerated      Discharge instructions      Comments:   Remove your dressing this evening 11/16/11 and then soak your thumb in slightly warm, dial soapy water or any antibacterial soap for  about 15-20 minutes. Then dry  your wound, place a small amount of silvadene cream daily and new dry dressing or band-aids.  Do this daily.   Discharge patient            Signed: Kathryne Hitch 11/16/2011, 6:53 AM

## 2011-11-19 ENCOUNTER — Encounter (HOSPITAL_COMMUNITY): Payer: Self-pay | Admitting: Orthopaedic Surgery

## 2012-01-03 ENCOUNTER — Encounter (INDEPENDENT_AMBULATORY_CARE_PROVIDER_SITE_OTHER): Payer: Self-pay | Admitting: General Surgery

## 2012-01-03 ENCOUNTER — Ambulatory Visit (INDEPENDENT_AMBULATORY_CARE_PROVIDER_SITE_OTHER): Payer: BC Managed Care – PPO | Admitting: General Surgery

## 2012-01-03 VITALS — BP 122/80 | HR 84 | Temp 98.0°F | Resp 16 | Ht 71.0 in | Wt 281.4 lb

## 2012-01-03 DIAGNOSIS — K645 Perianal venous thrombosis: Secondary | ICD-10-CM | POA: Insufficient documentation

## 2012-01-03 MED ORDER — HYDROCORTISONE 2.5 % RE CREA: TOPICAL_CREAM | Freq: Four times a day (QID) | RECTAL | Status: AC

## 2012-01-03 NOTE — Patient Instructions (Signed)
Stop using nystatin cream.  Use Anusol as directed.  Clean yourself gently with moist, soft toilet paper after BMS.  Continue warm water tub soaks.  Call if you are not improved in 3 weeks.

## 2012-01-03 NOTE — Progress Notes (Signed)
Patient ID: Terry Wolfe, male   DOB: Nov 12, 1960, 51 y.o.   MRN: [id number]  No chief complaint on file.   HPI Terry Wolfe is a 51 y.o. male.   HPI  He is referred by Dr. Lupita Raider for evaluation of a thrombosed external hemorrhoid.  He noted pain and swelling in the anal area about three weeks ago and saw Dr. Clelia Croft on 6/11.  He was started on warm water soaks and Nystatin cream.  He states he is a little better.  He denies recent constipation or diarrhea.   He also has a perianal rash.  He cleans himself vigorously after BMs.  Past Medical History  Diagnosis Date  . Hypertension   . Hyperlipidemia   . Obesity   . ED (erectile dysfunction)   . Abscess     left thigh  . Epididymitis, right ~ 2009  . Angina   . History of MRSA infection 03/2011; 11/15/11    left thigh; nasal swab  . Chronic bronchitis     "as a child"  . Pneumonia     "chronic as a child  . GERD (gastroesophageal reflux disease)   . H/O hiatal hernia   . Arthritis     "right knuckles"  . Enlarged prostate with lower urinary tract symptoms (LUTS)     "that's what gave me epididymitis"    Past Surgical History  Procedure Date  . Umbilical hernia repair 01/26/2004    Ventralex - Dr Maryagnes Amos  . Staple hemorrhoidectomy 09/07/2008    [facility] Dr Colin Benton  . Incise and drain abcess 03/2011    left thigh  . Tracheostomy 1972    trach for 4 days as child for accident  . Hernia repair 2005  . Finger fracture surgery 09/28/2001    left middle finger  . Incision and drainage of wound 1990's; 11/15/11    right; left  . Appendectomy 1977  . I&d extremity 11/15/2011    Procedure: IRRIGATION AND DEBRIDEMENT EXTREMITY;  Surgeon: Kathryne Hitch, MD;  Location: [facility] OR;  Service: Orthopedics;  Laterality: Left;  Irrigation and debridement left thumb    History reviewed. No pertinent family history.  Social History History  Substance Use Topics  . Smoking status: Never Smoker   . Smokeless tobacco: Never Used  . Alcohol  Use: Yes     11/16/11 "mixed drink q once and awhile"    No Known Allergies  Current Outpatient Prescriptions  Medication Sig Dispense Refill  . amLODipine (NORVASC) 10 MG tablet Take 10 mg by mouth daily.      Marland Kitchen aspirin EC 81 MG tablet Take 81 mg by mouth daily.      Marland Kitchen glucosamine-chondroitin 500-400 MG tablet Take 1 tablet by mouth 3 (three) times daily.        Marland Kitchen lisinopril (PRINIVIL,ZESTRIL) 20 MG tablet Take 20 mg by mouth daily.      . Multiple Vitamin (MULITIVITAMIN WITH MINERALS) TABS Take 1 tablet by mouth daily.      . rosuvastatin (CRESTOR) 10 MG tablet Take 10 mg by mouth daily.      . sildenafil (VIAGRA) 50 MG tablet Take 50 mg by mouth as needed. For erectile dysfunction      . silver sulfADIAZINE (SILVADENE) 1 % cream Apply topically daily. Apply small amount to wound daily  50 g  0  . Tamsulosin HCl (FLOMAX) 0.4 MG CAPS Take 0.4 mg by mouth daily as needed. For urges to urinate      .  hydrocortisone (ANUSOL-HC) 2.5 % rectal cream Place rectally 4 (four) times daily. Apply around anus for irritated & painful hemorrhoids  15 g  2    Review of Systems Review of Systems  Cardiovascular: Positive for leg swelling.  Gastrointestinal: Negative for diarrhea and constipation.    Blood pressure 122/80, pulse 84, temperature 98 F (36.7 C), resp. rate 16, height 5\' 11"  (1.803 m), weight 281 lb 6.4 oz (127.642 kg).  Physical Exam Physical Exam  Constitutional:       Overweight male in NAD  Neck:       Lower neck scar  Genitourinary:       External soft swelling in left lateral position.  No fissure.    Data Reviewed Dr. Alver Fisher note.  Assessment    Thrombosed left external hemorrhoid that is slowly resolving.  Perianal excoriation from over vigorous cleaning after BMs.    Plan    Gentle cleaning with soft tissue paper after BMs.  Anusol HC.  Continue tub soaks.  Call if no better in 3 weeks.       Demar Shad J 01/03/2012, 4:14 PM

## 2013-02-23 ENCOUNTER — Other Ambulatory Visit: Payer: Self-pay | Admitting: Family Medicine

## 2013-02-23 ENCOUNTER — Ambulatory Visit
Admission: RE | Admit: 2013-02-23 | Discharge: 2013-02-23 | Disposition: A | Discharge status: Home / Self Care | Payer: BC Managed Care – PPO | Source: Ambulatory Visit | Attending: Family Medicine | Admitting: Family Medicine

## 2013-02-23 DIAGNOSIS — M545 Low back pain: Secondary | ICD-10-CM

## 2013-04-14 HISTORY — PX: ANTERIOR FUSION CERVICAL SPINE: SUR626

## 2013-04-18 IMAGING — CR DG CHEST 1V PORT
1 series · 1 of 1 positions shown · non-contrast
Comparison: 11/15/2011.

CLINICAL DATA: Chest pain.

PORTABLE CHEST - 1 VIEW

[view not recorded]
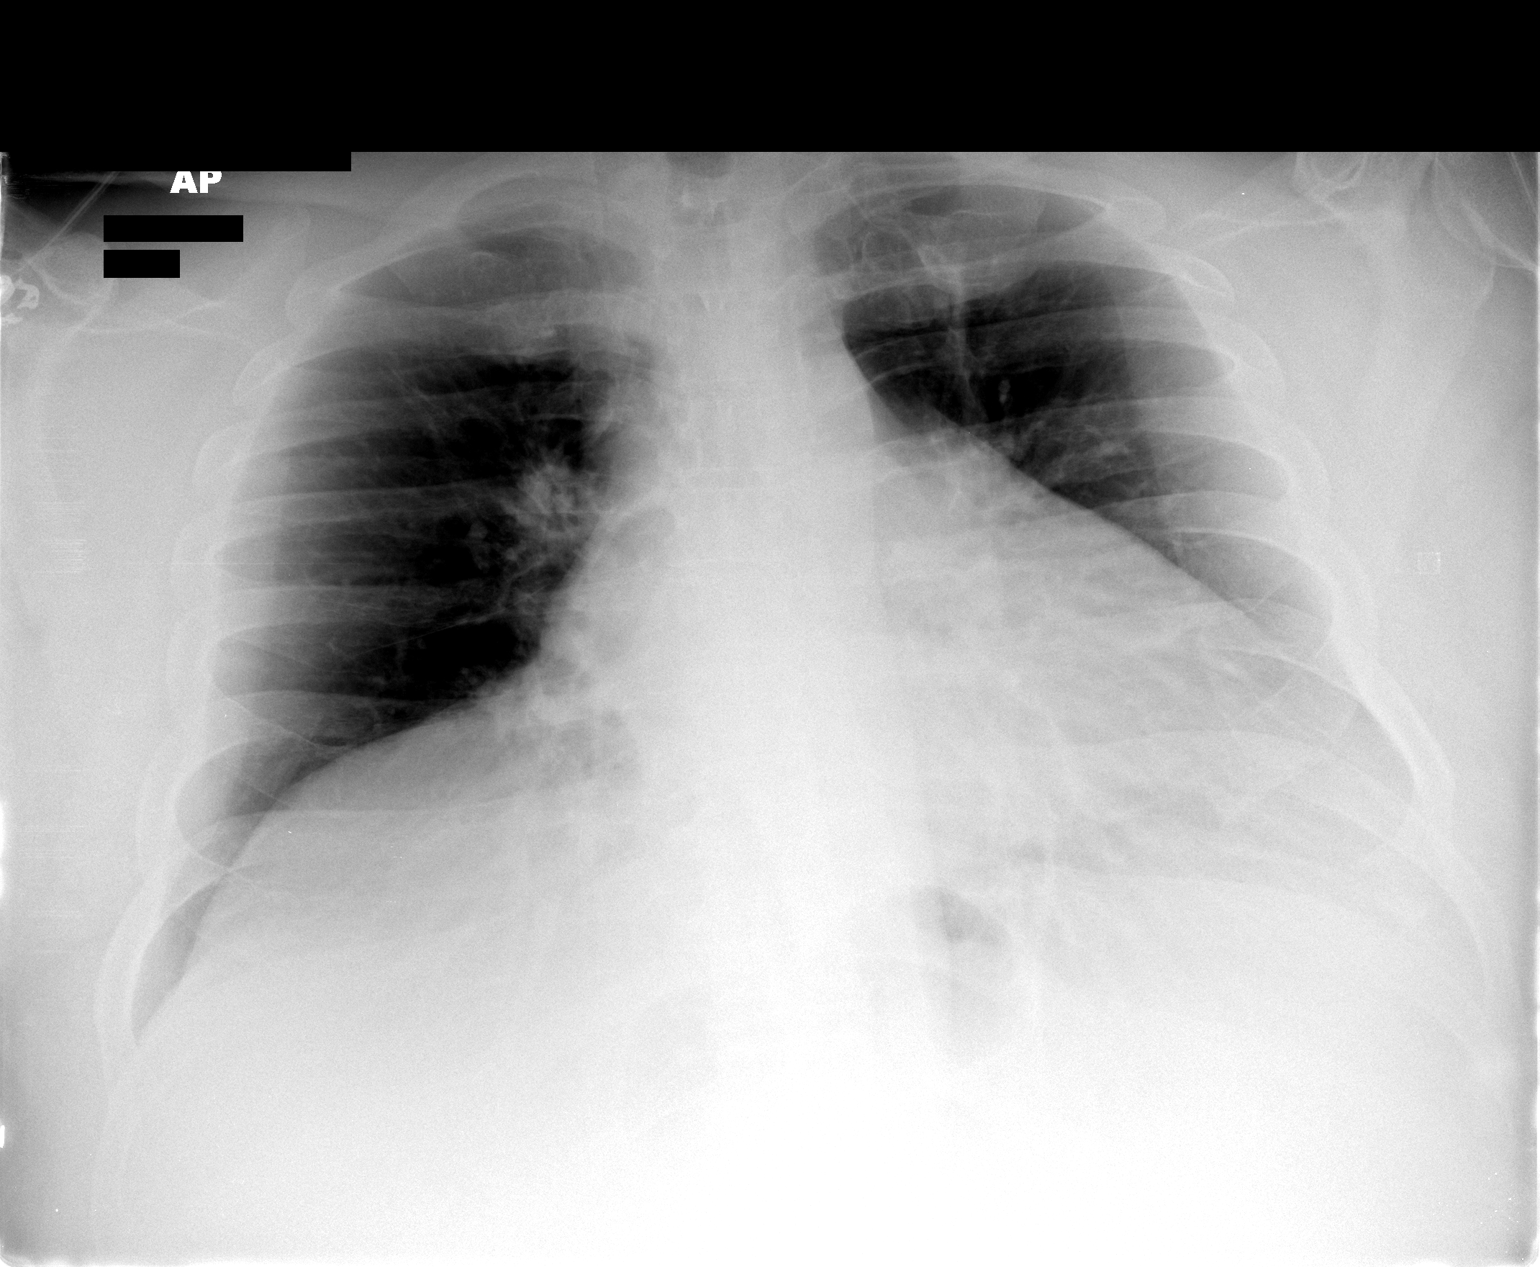

[1 of 1 positions shown; findings below may reference images not displayed]

FINDINGS: The heart is enlarged but stable.  Very low lung volumes
with vascular crowding and atelectasis.  No edema, infiltrates or
pneumothorax.
IMPRESSION: Cardiac enlargement and mild central vascular congestion.  No edema
or effusions.
Low lung volumes with vascular crowding and atelectasis.

## 2013-07-30 ENCOUNTER — Encounter (HOSPITAL_COMMUNITY): Payer: Self-pay | Admitting: Pharmacy Technician

## 2013-07-30 ENCOUNTER — Other Ambulatory Visit: Payer: Self-pay | Admitting: Orthopedic Surgery

## 2013-08-10 NOTE — Pre-Procedure Instructions (Addendum)
Terry Wolfe  08/10/2013   Your procedure is scheduled on:  Thursday, August 13, 2013  Report to [facility] Entrance "A" [place] at 9:00 AM.  Call this number if you have problems the morning of surgery: [phone]   Remember:   Do not eat food or drink liquids after midnight.   Take these medicines the morning of surgery with A SIP OF WATER: cyclobenzaprine (Flexeril) if needed, diazepam (Valium) if needed, hydrocodone-acetaminophen (Norco/Vicodin) if needed, tamsulosin (Flomax)  STOP taking meloxicam (Mobic), Aspirin, Goody's, BC's, Aleve (Naproxen), Ibuprofen (Advil or Motrin), Fish Oil, Vitamins, Herbal Supplements or any substance that could thin your blood starting today 08/11/13.   Do not wear jewelry.  Do not wear lotions, powders, or colognes. You may wear deodorant.  Do not shave 48 hours prior to surgery. Men may shave face and neck.  Do not bring valuables to the hospital.  [facility] is not responsible for any belongings or valuables.               Contacts, dentures or bridgework may not be worn into surgery.  Leave suitcase in the car. After surgery it may be brought to your room.  For patients admitted to the hospital, discharge time is determined by your treatment team.               Patients discharged the day of surgery will not be allowed to drive home.    Special Instructions:  Use CHG soap the night before surgery and the day of surgery. CHG soap should be used atleast twice.   Please read over the following fact sheets that you were given: Pain Booklet, Coughing and Deep Breathing, Blood Transfusion Information, MRSA Information and Surgical Site Infection Prevention

## 2013-08-11 ENCOUNTER — Encounter (HOSPITAL_COMMUNITY): Payer: Self-pay

## 2013-08-11 ENCOUNTER — Ambulatory Visit (HOSPITAL_COMMUNITY)
Admission: RE | Admit: 2013-08-11 | Discharge: 2013-08-11 | Disposition: A | Discharge status: Home / Self Care | Payer: BC Managed Care – PPO | Source: Ambulatory Visit | Attending: Orthopedic Surgery | Admitting: Orthopedic Surgery

## 2013-08-11 ENCOUNTER — Encounter (HOSPITAL_COMMUNITY)
Admission: RE | Admit: 2013-08-11 | Discharge: 2013-08-11 | Disposition: A | Discharge status: Home / Self Care | Payer: BC Managed Care – PPO | Source: Ambulatory Visit | Attending: Orthopedic Surgery | Admitting: Orthopedic Surgery

## 2013-08-11 DIAGNOSIS — I10 Essential (primary) hypertension: Secondary | ICD-10-CM | POA: Insufficient documentation

## 2013-08-11 DIAGNOSIS — Z01818 Encounter for other preprocedural examination: Secondary | ICD-10-CM | POA: Insufficient documentation

## 2013-08-11 DIAGNOSIS — G4730 Sleep apnea, unspecified: Secondary | ICD-10-CM | POA: Insufficient documentation

## 2013-08-11 HISTORY — DX: Sleep apnea, unspecified: G47.30

## 2013-08-11 HISTORY — DX: Other complications of anesthesia, initial encounter: T88.59XA

## 2013-08-11 HISTORY — DX: Other specified postprocedural states: Z98.890

## 2013-08-11 HISTORY — DX: Other specified postprocedural states: R11.2

## 2013-08-11 HISTORY — DX: Adverse effect of unspecified anesthetic, initial encounter: T41.45XA

## 2013-08-11 LAB — CBC WITH DIFFERENTIAL/PLATELET
BASOS ABS: 0.1 10*3/uL (ref 0.0–0.1)
Basophils Relative: 0 % (ref 0–1)
EOS ABS: 0.3 10*3/uL (ref 0.0–0.7)
EOS PCT: 3 % (ref 0–5)
HCT: 45.5 % (ref 39.0–52.0)
Hemoglobin: 15.7 g/dL (ref 13.0–17.0)
Lymphocytes Relative: 34 % (ref 12–46)
Lymphs Abs: 4 10*3/uL (ref 0.7–4.0)
MCH: 29.2 pg (ref 26.0–34.0)
MCHC: 34.5 g/dL (ref 30.0–36.0)
MCV: 84.7 fL (ref 78.0–100.0)
Monocytes Absolute: 0.7 10*3/uL (ref 0.1–1.0)
Monocytes Relative: 6 % (ref 3–12)
NEUTROS PCT: 58 % (ref 43–77)
Neutro Abs: 6.9 10*3/uL (ref 1.7–7.7)
PLATELETS: 284 10*3/uL (ref 150–400)
RBC: 5.37 MIL/uL (ref 4.22–5.81)
RDW: 13.2 % (ref 11.5–15.5)
WBC: 11.9 10*3/uL — ABNORMAL HIGH (ref 4.0–10.5)

## 2013-08-11 LAB — URINALYSIS, ROUTINE W REFLEX MICROSCOPIC
Bilirubin Urine: NEGATIVE
Glucose, UA: NEGATIVE mg/dL
HGB URINE DIPSTICK: NEGATIVE
Ketones, ur: NEGATIVE mg/dL
Leukocytes, UA: NEGATIVE
Nitrite: NEGATIVE
PROTEIN: NEGATIVE mg/dL
Specific Gravity, Urine: 1.023 (ref 1.005–1.030)
UROBILINOGEN UA: 0.2 mg/dL (ref 0.0–1.0)
pH: 6.5 (ref 5.0–8.0)

## 2013-08-11 LAB — COMPREHENSIVE METABOLIC PANEL
ALBUMIN: 4.1 g/dL (ref 3.5–5.2)
ALT: 27 U/L (ref 0–53)
AST: 23 U/L (ref 0–37)
Alkaline Phosphatase: 104 U/L (ref 39–117)
BUN: 11 mg/dL (ref 6–23)
CALCIUM: 9.4 mg/dL (ref 8.4–10.5)
CO2: 26 mEq/L (ref 19–32)
Chloride: 103 mEq/L (ref 96–112)
Creatinine, Ser: 0.81 mg/dL (ref 0.50–1.35)
GFR calc non Af Amer: >90 mL/min (ref >90)
Glucose, Bld: 108 mg/dL — ABNORMAL HIGH (ref 70–99)
POTASSIUM: 3.8 meq/L (ref 3.7–5.3)
SODIUM: 145 meq/L (ref 137–147)
TOTAL PROTEIN: 7.4 g/dL (ref 6.0–8.3)
Total Bilirubin: 0.3 mg/dL (ref 0.3–1.2)

## 2013-08-11 LAB — PROTIME-INR
INR: 0.95 (ref 0.00–1.49)
Prothrombin Time: 12.5 seconds (ref 11.6–15.2)

## 2013-08-11 LAB — SURGICAL PCR SCREEN
MRSA, PCR: NEGATIVE
Staphylococcus aureus: NEGATIVE

## 2013-08-11 LAB — TYPE AND SCREEN
ABO/RH(D): O POS
Antibody Screen: NEGATIVE

## 2013-08-11 LAB — APTT: aPTT: 30 seconds (ref 24–37)

## 2013-08-11 LAB — ABO/RH: ABO/RH(D): O POS

## 2013-08-11 NOTE — Progress Notes (Signed)
   Pt reports having sleep study done at [place] on [facility] in 06/2013. CPAP has been ordered but pt has not picked it up yet. Sleep study requested.  Pt's PCP is Dr. Serita Grammes with [facility] on [address]. Last office visit requested.  Pt denies seeing a Cardiologist or ever having an ECHO, stress test or cardiac cath done.

## 2013-08-12 ENCOUNTER — Encounter (HOSPITAL_COMMUNITY): Payer: Self-pay

## 2013-08-12 MED ORDER — DEXTROSE 5 % IV SOLN
3.0000 g | INTRAVENOUS | Status: AC
  Administered 2013-08-13: 3 g via INTRAVENOUS
  Filled 2013-08-12: qty 3000

## 2013-08-12 NOTE — Progress Notes (Signed)
Anesthesia Chart Review:  Patient is a 53 year old male scheduled for L4-5, L5-S1 decompression on 08/13/13 by Dr. Lynann Bologna. History includes morbid obesity, HTN, post-operative N/V, HLD, OSA by 07/14/13 sleep study, ED. Angina is also listed on his history, but his PAT RN stated that he denied at his PAT visit yesterday. PCP is Dr. Mayra Neer who saw patient on 08/11/13 for HTN follow-up and is aware of plans for back surgery.  EKG on 08/01/13 showed NSR, minimal voltage criteria for LVH, cannot rule out anterior infarct (age undetermined).  No significant change since 11/15/11.   CXR on 08/11/13 showed no active cardiopulmonary disease.  Preoperative labs noted.  His EKG is stable.  No chest pain per his PAT RN.  He saw his PCP earlier this week who is aware of plans for surgery.  Further evaluation by his assigned anesthesiologist.  If no new symptoms or acute changes then I am anticipating that he can proceed as planned.  George Hugh [facility]/Anesthesiology Phone [phone] 08/12/2013 11:27 AM

## 2013-08-13 ENCOUNTER — Encounter (HOSPITAL_COMMUNITY): Payer: BC Managed Care – PPO | Admitting: Vascular Surgery

## 2013-08-13 ENCOUNTER — Observation Stay (HOSPITAL_COMMUNITY)
Admission: RE | Admit: 2013-08-13 | Discharge: 2013-08-14 | Disposition: A | Discharge status: Home / Self Care | Payer: BC Managed Care – PPO | Source: Ambulatory Visit | Attending: Orthopedic Surgery | Admitting: Orthopedic Surgery

## 2013-08-13 ENCOUNTER — Encounter (HOSPITAL_COMMUNITY)
Admission: RE | Disposition: A | Discharge status: Home / Self Care | Payer: Self-pay | Source: Ambulatory Visit | Attending: Orthopedic Surgery

## 2013-08-13 ENCOUNTER — Ambulatory Visit (HOSPITAL_COMMUNITY): Payer: BC Managed Care – PPO | Admitting: Anesthesiology

## 2013-08-13 ENCOUNTER — Ambulatory Visit (HOSPITAL_COMMUNITY): Payer: BC Managed Care – PPO

## 2013-08-13 ENCOUNTER — Encounter (HOSPITAL_COMMUNITY): Payer: Self-pay | Admitting: *Deleted

## 2013-08-13 DIAGNOSIS — N401 Enlarged prostate with lower urinary tract symptoms: Secondary | ICD-10-CM | POA: Insufficient documentation

## 2013-08-13 DIAGNOSIS — I10 Essential (primary) hypertension: Secondary | ICD-10-CM | POA: Insufficient documentation

## 2013-08-13 DIAGNOSIS — N138 Other obstructive and reflux uropathy: Secondary | ICD-10-CM | POA: Insufficient documentation

## 2013-08-13 DIAGNOSIS — M4800 Spinal stenosis, site unspecified: Secondary | ICD-10-CM | POA: Diagnosis present

## 2013-08-13 DIAGNOSIS — Z8614 Personal history of Methicillin resistant Staphylococcus aureus infection: Secondary | ICD-10-CM | POA: Insufficient documentation

## 2013-08-13 DIAGNOSIS — E669 Obesity, unspecified: Secondary | ICD-10-CM | POA: Insufficient documentation

## 2013-08-13 DIAGNOSIS — G4730 Sleep apnea, unspecified: Secondary | ICD-10-CM | POA: Insufficient documentation

## 2013-08-13 DIAGNOSIS — K219 Gastro-esophageal reflux disease without esophagitis: Secondary | ICD-10-CM | POA: Insufficient documentation

## 2013-08-13 DIAGNOSIS — E785 Hyperlipidemia, unspecified: Secondary | ICD-10-CM | POA: Insufficient documentation

## 2013-08-13 DIAGNOSIS — M19049 Primary osteoarthritis, unspecified hand: Secondary | ICD-10-CM | POA: Insufficient documentation

## 2013-08-13 DIAGNOSIS — Z7982 Long term (current) use of aspirin: Secondary | ICD-10-CM | POA: Insufficient documentation

## 2013-08-13 DIAGNOSIS — M48061 Spinal stenosis, lumbar region without neurogenic claudication: Principal | ICD-10-CM | POA: Insufficient documentation

## 2013-08-13 DIAGNOSIS — K449 Diaphragmatic hernia without obstruction or gangrene: Secondary | ICD-10-CM | POA: Insufficient documentation

## 2013-08-13 HISTORY — PX: DECOMPRESSIVE LUMBAR LAMINECTOMY LEVEL 2: SHX5792

## 2013-08-13 SURGERY — DECOMPRESSIVE LUMBAR LAMINECTOMY LEVEL 2
Anesthesia: General | Site: Spine Lumbar

## 2013-08-13 MED ORDER — SUCCINYLCHOLINE CHLORIDE 20 MG/ML IJ SOLN
INTRAMUSCULAR | Status: AC
  Filled 2013-08-13: qty 1

## 2013-08-13 MED ORDER — ZOLPIDEM TARTRATE 5 MG PO TABS
5.0000 mg | ORAL_TABLET | Freq: Every evening | ORAL | Status: DC | PRN
  Administered 2013-08-13: 5 mg via ORAL
  Filled 2013-08-13: qty 1

## 2013-08-13 MED ORDER — FENTANYL CITRATE 0.05 MG/ML IJ SOLN
INTRAMUSCULAR | Status: AC
  Filled 2013-08-13: qty 5

## 2013-08-13 MED ORDER — PROPOFOL 10 MG/ML IV BOLUS
INTRAVENOUS | Status: AC
  Filled 2013-08-13: qty 20

## 2013-08-13 MED ORDER — 0.9 % SODIUM CHLORIDE (POUR BTL) OPTIME
TOPICAL | Status: DC | PRN
  Administered 2013-08-13: 1000 mL

## 2013-08-13 MED ORDER — GLYCOPYRROLATE 0.2 MG/ML IJ SOLN
INTRAMUSCULAR | Status: AC
  Filled 2013-08-13: qty 5

## 2013-08-13 MED ORDER — SODIUM CHLORIDE 0.9 % IJ SOLN: 3.0000 mL | INTRAMUSCULAR | Status: DC | PRN

## 2013-08-13 MED ORDER — SODIUM CHLORIDE 0.9 % IJ SOLN
INTRAMUSCULAR | Status: AC
Start: 2013-08-13 — End: 2013-08-13
  Filled 2013-08-13: qty 10

## 2013-08-13 MED ORDER — LIDOCAINE HCL (CARDIAC) 20 MG/ML IV SOLN
INTRAVENOUS | Status: AC
  Filled 2013-08-13: qty 5

## 2013-08-13 MED ORDER — DEXAMETHASONE SODIUM PHOSPHATE 10 MG/ML IJ SOLN
INTRAMUSCULAR | Status: DC | PRN
  Administered 2013-08-13: 10 mg via INTRAVENOUS

## 2013-08-13 MED ORDER — DIAZEPAM 5 MG PO TABS
ORAL_TABLET | ORAL | Status: AC
  Filled 2013-08-13: qty 1

## 2013-08-13 MED ORDER — DEXTROSE 5 % IV SOLN
INTRAVENOUS | Status: DC | PRN
  Administered 2013-08-13: 16:00:00 via INTRAVENOUS

## 2013-08-13 MED ORDER — SODIUM CHLORIDE 0.9 % IJ SOLN: 3.0000 mL | Freq: Two times a day (BID) | INTRAMUSCULAR | Status: DC

## 2013-08-13 MED ORDER — ROCURONIUM BROMIDE 50 MG/5ML IV SOLN
INTRAVENOUS | Status: AC
  Filled 2013-08-13: qty 1

## 2013-08-13 MED ORDER — METHYLPREDNISOLONE ACETATE 40 MG/ML IJ SUSP
INTRAMUSCULAR | Status: AC
  Filled 2013-08-13: qty 1

## 2013-08-13 MED ORDER — GLYCOPYRROLATE 0.2 MG/ML IJ SOLN
INTRAMUSCULAR | Status: DC | PRN
  Administered 2013-08-13: 1 mg via INTRAVENOUS

## 2013-08-13 MED ORDER — ROCURONIUM BROMIDE 100 MG/10ML IV SOLN
INTRAVENOUS | Status: DC | PRN
  Administered 2013-08-13: 10 mg via INTRAVENOUS
  Administered 2013-08-13: 50 mg via INTRAVENOUS
  Administered 2013-08-13: 10 mg via INTRAVENOUS
  Administered 2013-08-13: 20 mg via INTRAVENOUS

## 2013-08-13 MED ORDER — LIDOCAINE HCL (CARDIAC) 20 MG/ML IV SOLN
INTRAVENOUS | Status: DC | PRN
  Administered 2013-08-13 (×2): 100 mg via INTRAVENOUS

## 2013-08-13 MED ORDER — ONDANSETRON HCL 4 MG/2ML IJ SOLN
INTRAMUSCULAR | Status: AC
  Filled 2013-08-13: qty 2

## 2013-08-13 MED ORDER — ONDANSETRON HCL 4 MG/2ML IJ SOLN: 4.0000 mg | INTRAMUSCULAR | Status: DC | PRN

## 2013-08-13 MED ORDER — HEMOSTATIC AGENTS (NO CHARGE) OPTIME
TOPICAL | Status: DC | PRN
  Administered 2013-08-13: 1 via TOPICAL

## 2013-08-13 MED ORDER — IRBESARTAN 300 MG PO TABS
300.0000 mg | ORAL_TABLET | Freq: Every day | ORAL | Status: DC
  Administered 2013-08-14: 300 mg via ORAL
  Filled 2013-08-13: qty 1

## 2013-08-13 MED ORDER — SODIUM CHLORIDE 0.9 % IV SOLN
250.0000 mL | INTRAVENOUS | Status: DC
  Administered 2013-08-13: 250 mL via INTRAVENOUS

## 2013-08-13 MED ORDER — OXYCODONE HCL 5 MG PO TABS
ORAL_TABLET | ORAL | Status: AC
  Filled 2013-08-13: qty 1

## 2013-08-13 MED ORDER — OXYCODONE HCL 5 MG PO TABS
5.0000 mg | ORAL_TABLET | Freq: Once | ORAL | Status: AC | PRN
  Administered 2013-08-13: 5 mg via ORAL

## 2013-08-13 MED ORDER — THROMBIN 20000 UNITS EX SOLR
CUTANEOUS | Status: AC
  Filled 2013-08-13: qty 20000

## 2013-08-13 MED ORDER — MENTHOL 3 MG MT LOZG: 1.0000 | LOZENGE | OROMUCOSAL | Status: DC | PRN

## 2013-08-13 MED ORDER — OXYCODONE HCL 5 MG/5ML PO SOLN: 5.0000 mg | Freq: Once | ORAL | Status: AC | PRN

## 2013-08-13 MED ORDER — LACTATED RINGERS IV SOLN
INTRAVENOUS | Status: DC
  Administered 2013-08-13: 10:00:00 via INTRAVENOUS

## 2013-08-13 MED ORDER — DIAZEPAM 5 MG PO TABS
5.0000 mg | ORAL_TABLET | Freq: Four times a day (QID) | ORAL | Status: DC | PRN
  Administered 2013-08-13 – 2013-08-14 (×2): 5 mg via ORAL
  Filled 2013-08-13: qty 1

## 2013-08-13 MED ORDER — SENNOSIDES-DOCUSATE SODIUM 8.6-50 MG PO TABS: 1.0000 | ORAL_TABLET | Freq: Every evening | ORAL | Status: DC | PRN

## 2013-08-13 MED ORDER — ACETAMINOPHEN 325 MG PO TABS: 650.0000 mg | ORAL_TABLET | ORAL | Status: DC | PRN

## 2013-08-13 MED ORDER — NEOSTIGMINE METHYLSULFATE 1 MG/ML IJ SOLN
INTRAMUSCULAR | Status: DC | PRN
  Administered 2013-08-13: 5 mg via INTRAVENOUS

## 2013-08-13 MED ORDER — LACTATED RINGERS IV SOLN
INTRAVENOUS | Status: DC | PRN
  Administered 2013-08-13 (×3): via INTRAVENOUS

## 2013-08-13 MED ORDER — METHYLPREDNISOLONE ACETATE 40 MG/ML IJ SUSP
INTRAMUSCULAR | Status: DC | PRN
  Administered 2013-08-13: 40 mg via INTRA_ARTICULAR

## 2013-08-13 MED ORDER — GLUCOSAMINE-CHONDROITIN 500-400 MG PO TABS: 1.0000 | ORAL_TABLET | Freq: Three times a day (TID) | ORAL | Status: DC

## 2013-08-13 MED ORDER — ALUM & MAG HYDROXIDE-SIMETH 200-200-20 MG/5ML PO SUSP: 30.0000 mL | Freq: Four times a day (QID) | ORAL | Status: DC | PRN

## 2013-08-13 MED ORDER — PHENYLEPHRINE 40 MCG/ML (10ML) SYRINGE FOR IV PUSH (FOR BLOOD PRESSURE SUPPORT)
PREFILLED_SYRINGE | INTRAVENOUS | Status: AC
  Filled 2013-08-13: qty 10

## 2013-08-13 MED ORDER — FENTANYL CITRATE 0.05 MG/ML IJ SOLN
INTRAMUSCULAR | Status: DC | PRN
  Administered 2013-08-13: 50 ug via INTRAVENOUS
  Administered 2013-08-13: 100 ug via INTRAVENOUS
  Administered 2013-08-13 (×3): 50 ug via INTRAVENOUS
  Administered 2013-08-13: 100 ug via INTRAVENOUS
  Administered 2013-08-13 (×2): 50 ug via INTRAVENOUS

## 2013-08-13 MED ORDER — OXYCODONE-ACETAMINOPHEN 5-325 MG PO TABS
1.0000 | ORAL_TABLET | ORAL | Status: DC | PRN
  Administered 2013-08-13 – 2013-08-14 (×3): 2 via ORAL
  Filled 2013-08-13 (×3): qty 2

## 2013-08-13 MED ORDER — MIDAZOLAM HCL 5 MG/5ML IJ SOLN
INTRAMUSCULAR | Status: DC | PRN
  Administered 2013-08-13 (×2): 1 mg via INTRAVENOUS

## 2013-08-13 MED ORDER — DEXAMETHASONE SODIUM PHOSPHATE 4 MG/ML IJ SOLN: INTRAMUSCULAR | Status: DC | PRN

## 2013-08-13 MED ORDER — HYDROCHLOROTHIAZIDE 25 MG PO TABS
25.0000 mg | ORAL_TABLET | Freq: Every day | ORAL | Status: DC
  Administered 2013-08-14: 25 mg via ORAL
  Filled 2013-08-13: qty 1

## 2013-08-13 MED ORDER — SUCCINYLCHOLINE CHLORIDE 20 MG/ML IJ SOLN
INTRAMUSCULAR | Status: DC | PRN
  Administered 2013-08-13: 140 mg via INTRAVENOUS

## 2013-08-13 MED ORDER — PHENOL 1.4 % MT LIQD: 1.0000 | OROMUCOSAL | Status: DC | PRN

## 2013-08-13 MED ORDER — BISACODYL 5 MG PO TBEC: 5.0000 mg | DELAYED_RELEASE_TABLET | Freq: Every day | ORAL | Status: DC | PRN

## 2013-08-13 MED ORDER — ONDANSETRON HCL 4 MG/2ML IJ SOLN: 4.0000 mg | Freq: Once | INTRAMUSCULAR | Status: DC | PRN

## 2013-08-13 MED ORDER — THROMBIN 20000 UNITS EX SOLR
CUTANEOUS | Status: DC | PRN
  Administered 2013-08-13: 17:00:00 via TOPICAL

## 2013-08-13 MED ORDER — MIDAZOLAM HCL 2 MG/2ML IJ SOLN
INTRAMUSCULAR | Status: AC
  Filled 2013-08-13: qty 2

## 2013-08-13 MED ORDER — PHENYLEPHRINE HCL 10 MG/ML IJ SOLN
10.0000 mg | INTRAVENOUS | Status: DC | PRN
  Administered 2013-08-13: 20 ug/min via INTRAVENOUS

## 2013-08-13 MED ORDER — DOCUSATE SODIUM 100 MG PO CAPS
100.0000 mg | ORAL_CAPSULE | Freq: Two times a day (BID) | ORAL | Status: DC
  Administered 2013-08-13 – 2013-08-14 (×2): 100 mg via ORAL
  Filled 2013-08-13 (×3): qty 1

## 2013-08-13 MED ORDER — BUPIVACAINE-EPINEPHRINE (PF) 0.25% -1:200000 IJ SOLN
INTRAMUSCULAR | Status: AC
  Filled 2013-08-13: qty 30

## 2013-08-13 MED ORDER — TAMSULOSIN HCL 0.4 MG PO CAPS
0.4000 mg | ORAL_CAPSULE | Freq: Every day | ORAL | Status: DC
  Administered 2013-08-14: 0.4 mg via ORAL
  Filled 2013-08-13: qty 1

## 2013-08-13 MED ORDER — ARTIFICIAL TEARS OP OINT
TOPICAL_OINTMENT | OPHTHALMIC | Status: DC | PRN
  Administered 2013-08-13: 1 via OPHTHALMIC

## 2013-08-13 MED ORDER — OLMESARTAN-AMLODIPINE-HCTZ 20-5-12.5 MG PO TABS: 2.0000 | ORAL_TABLET | Freq: Every day | ORAL | Status: DC

## 2013-08-13 MED ORDER — PHENYLEPHRINE HCL 10 MG/ML IJ SOLN
INTRAMUSCULAR | Status: DC | PRN
  Administered 2013-08-13 (×4): 80 ug via INTRAVENOUS

## 2013-08-13 MED ORDER — ACETAMINOPHEN 650 MG RE SUPP: 650.0000 mg | RECTAL | Status: DC | PRN

## 2013-08-13 MED ORDER — AMLODIPINE BESYLATE 10 MG PO TABS
10.0000 mg | ORAL_TABLET | Freq: Every day | ORAL | Status: DC
  Administered 2013-08-14: 10 mg via ORAL
  Filled 2013-08-13: qty 1

## 2013-08-13 MED ORDER — POVIDONE-IODINE 7.5 % EX SOLN
Freq: Once | CUTANEOUS | Status: DC
  Filled 2013-08-13: qty 118

## 2013-08-13 MED ORDER — NEOSTIGMINE METHYLSULFATE 1 MG/ML IJ SOLN
INTRAMUSCULAR | Status: AC
  Filled 2013-08-13: qty 10

## 2013-08-13 MED ORDER — FLEET ENEMA 7-19 GM/118ML RE ENEM: 1.0000 | ENEMA | Freq: Once | RECTAL | Status: AC | PRN

## 2013-08-13 MED ORDER — PROPOFOL 10 MG/ML IV BOLUS
INTRAVENOUS | Status: DC | PRN
  Administered 2013-08-13: 240 mg via INTRAVENOUS

## 2013-08-13 MED ORDER — ATORVASTATIN CALCIUM 20 MG PO TABS
20.0000 mg | ORAL_TABLET | Freq: Every day | ORAL | Status: DC
  Filled 2013-08-13: qty 1

## 2013-08-13 MED ORDER — HYDROMORPHONE HCL PF 1 MG/ML IJ SOLN
INTRAMUSCULAR | Status: AC
  Filled 2013-08-13: qty 1

## 2013-08-13 MED ORDER — HYDROMORPHONE HCL PF 1 MG/ML IJ SOLN
0.2500 mg | INTRAMUSCULAR | Status: DC | PRN
  Administered 2013-08-13 (×4): 0.5 mg via INTRAVENOUS

## 2013-08-13 MED ORDER — DEXAMETHASONE SODIUM PHOSPHATE 10 MG/ML IJ SOLN
INTRAMUSCULAR | Status: AC
  Filled 2013-08-13: qty 1

## 2013-08-13 MED ORDER — BUPIVACAINE-EPINEPHRINE 0.25% -1:200000 IJ SOLN
INTRAMUSCULAR | Status: DC | PRN
  Administered 2013-08-13: 22 mL

## 2013-08-13 MED ORDER — DEXAMETHASONE SODIUM PHOSPHATE 4 MG/ML IJ SOLN
INTRAMUSCULAR | Status: AC
  Filled 2013-08-13: qty 2

## 2013-08-13 MED ORDER — MORPHINE SULFATE 2 MG/ML IJ SOLN
1.0000 mg | INTRAMUSCULAR | Status: DC | PRN
  Administered 2013-08-14: 2 mg via INTRAVENOUS
  Filled 2013-08-13: qty 1

## 2013-08-13 MED ORDER — ONDANSETRON HCL 4 MG/2ML IJ SOLN
INTRAMUSCULAR | Status: DC | PRN
  Administered 2013-08-13: 4 mg via INTRAVENOUS

## 2013-08-13 MED ORDER — CEFAZOLIN SODIUM 1-5 GM-% IV SOLN
1.0000 g | Freq: Three times a day (TID) | INTRAVENOUS | Status: AC
  Administered 2013-08-13 – 2013-08-14 (×2): 1 g via INTRAVENOUS
  Filled 2013-08-13 (×2): qty 50

## 2013-08-13 SURGICAL SUPPLY — 67 items
BENZOIN TINCTURE PRP APPL 2/3 (GAUZE/BANDAGES/DRESSINGS) IMPLANT
BUR ROUND PRECISION 4.0 (BURR) ×2 IMPLANT
BUR ROUND PRECISION 4.0MM (BURR) ×1
CANISTER SUCTION 2500CC (MISCELLANEOUS) ×6 IMPLANT
CARTRIDGE OIL MAESTRO DRILL (MISCELLANEOUS) ×1 IMPLANT
CLOSURE STERI-STRIP 1/2X4 (GAUZE/BANDAGES/DRESSINGS) ×1
CLOSURE WOUND 1/2 X4 (GAUZE/BANDAGES/DRESSINGS)
CLOTH BEACON ORANGE TIMEOUT ST (SAFETY) ×3 IMPLANT
CLSR STERI-STRIP ANTIMIC 1/2X4 (GAUZE/BANDAGES/DRESSINGS) ×2 IMPLANT
CONT SPEC STER OR (MISCELLANEOUS) ×3 IMPLANT
CORDS BIPOLAR (ELECTRODE) ×3 IMPLANT
COVER SURGICAL LIGHT HANDLE (MISCELLANEOUS) ×3 IMPLANT
DIFFUSER DRILL AIR PNEUMATIC (MISCELLANEOUS) ×3 IMPLANT
DRAIN CHANNEL 15F RND FF W/TCR (WOUND CARE) ×3 IMPLANT
DRAPE POUCH INSTRU U-SHP 10X18 (DRAPES) ×6 IMPLANT
DRAPE SURG 17X23 STRL (DRAPES) ×12 IMPLANT
DURAPREP 26ML APPLICATOR (WOUND CARE) ×3 IMPLANT
ELECT BLADE 4.0 EZ CLEAN MEGAD (MISCELLANEOUS)
ELECT BLADE 6.5 EXT (BLADE) IMPLANT
ELECT CAUTERY BLADE 6.4 (BLADE) ×3 IMPLANT
ELECT REM PT RETURN 9FT ADLT (ELECTROSURGICAL) ×3
ELECTRODE BLDE 4.0 EZ CLN MEGD (MISCELLANEOUS) IMPLANT
ELECTRODE REM PT RTRN 9FT ADLT (ELECTROSURGICAL) ×1 IMPLANT
EVACUATOR SILICONE 100CC (DRAIN) ×3 IMPLANT
FILTER STRAW FLUID ASPIR (MISCELLANEOUS) ×3 IMPLANT
GAUZE SPONGE 4X4 16PLY XRAY LF (GAUZE/BANDAGES/DRESSINGS) ×9 IMPLANT
GLOVE BIO SURGEON STRL SZ8 (GLOVE) ×9 IMPLANT
GLOVE BIOGEL PI IND STRL 8 (GLOVE) ×2 IMPLANT
GLOVE BIOGEL PI INDICATOR 8 (GLOVE) ×4
GOWN STRL NON-REIN LRG LVL3 (GOWN DISPOSABLE) ×6 IMPLANT
IV CATH 14GX2 1/4 (CATHETERS) ×3 IMPLANT
KIT BASIN OR (CUSTOM PROCEDURE TRAY) ×3 IMPLANT
KIT ROOM TURNOVER OR (KITS) ×3 IMPLANT
NEEDLE 18GX1X1/2 (RX/OR ONLY) (NEEDLE) ×3 IMPLANT
NEEDLE 22X1 1/2 (OR ONLY) (NEEDLE) ×3 IMPLANT
NEEDLE HYPO 25GX1X1/2 BEV (NEEDLE) ×3 IMPLANT
NEEDLE SPNL 18GX3.5 QUINCKE PK (NEEDLE) ×6 IMPLANT
NS IRRIG 1000ML POUR BTL (IV SOLUTION) ×9 IMPLANT
OIL CARTRIDGE MAESTRO DRILL (MISCELLANEOUS) ×3
PACK LAMINECTOMY ORTHO (CUSTOM PROCEDURE TRAY) ×3 IMPLANT
PACK UNIVERSAL I (CUSTOM PROCEDURE TRAY) ×3 IMPLANT
PAD ARMBOARD 7.5X6 YLW CONV (MISCELLANEOUS) ×6 IMPLANT
PATTIES SURGICAL .5 X.5 (GAUZE/BANDAGES/DRESSINGS) IMPLANT
PATTIES SURGICAL .5 X1 (DISPOSABLE) ×3 IMPLANT
SPONGE GAUZE 4X4 12PLY (GAUZE/BANDAGES/DRESSINGS) ×3 IMPLANT
SPONGE INTESTINAL PEANUT (DISPOSABLE) ×9 IMPLANT
STRIP CLOSURE SKIN 1/2X4 (GAUZE/BANDAGES/DRESSINGS) IMPLANT
SURGIFLO TRUKIT (HEMOSTASIS) ×3 IMPLANT
SUT ETHILON 2 0 FS 18 (SUTURE) ×3 IMPLANT
SUT VIC AB 0 CT1 18XCR BRD 8 (SUTURE) ×1 IMPLANT
SUT VIC AB 0 CT1 27 (SUTURE)
SUT VIC AB 0 CT1 27XBRD ANBCTR (SUTURE) IMPLANT
SUT VIC AB 0 CT1 8-18 (SUTURE) ×2
SUT VIC AB 1 CT1 18XCR BRD 8 (SUTURE) ×1 IMPLANT
SUT VIC AB 1 CT1 8-18 (SUTURE) ×2
SUT VIC AB 2-0 CT2 18 VCP726D (SUTURE) ×3 IMPLANT
SYR 20CC LL (SYRINGE) IMPLANT
SYR 50ML SLIP (SYRINGE) IMPLANT
SYR BULB IRRIGATION 50ML (SYRINGE) ×3 IMPLANT
SYR CONTROL 10ML LL (SYRINGE) ×3 IMPLANT
SYR TB 1ML 26GX3/8 SAFETY (SYRINGE) ×6 IMPLANT
SYR TB 1ML LUER SLIP (SYRINGE) ×6 IMPLANT
TAPE CLOTH SURG 4X10 WHT LF (GAUZE/BANDAGES/DRESSINGS) ×3 IMPLANT
TOWEL OR 17X24 6PK STRL BLUE (TOWEL DISPOSABLE) ×3 IMPLANT
TOWEL OR 17X26 10 PK STRL BLUE (TOWEL DISPOSABLE) ×3 IMPLANT
WATER STERILE IRR 1000ML POUR (IV SOLUTION) ×3 IMPLANT
YANKAUER SUCT BULB TIP NO VENT (SUCTIONS) ×3 IMPLANT

## 2013-08-13 NOTE — Progress Notes (Signed)
Patient updated on delay 

## 2013-08-13 NOTE — Anesthesia Preprocedure Evaluation (Addendum)
Anesthesia Evaluation  Patient identified by MRN, date of birth, ID band Patient awake    Reviewed: Allergy & Precautions, H&P , NPO status , Patient's Chart, lab work & pertinent test results, reviewed documented beta blocker date and time   History of Anesthesia Complications Negative for: history of anesthetic complications  Airway Mallampati: II TM Distance: >3 FB Neck ROM: Full    Dental  (+) Partial Lower, Teeth Intact and Dental Advisory Given   Pulmonary sleep apnea and Continuous Positive Airway Pressure Ventilation , neg COPD   Pulmonary exam normal       Cardiovascular Exercise Tolerance: Poor hypertension, Pt. on medications - angina- Past MI and - CHF - dysrhythmias - Valvular Problems/MurmursRhythm:Regular Rate:Normal     Neuro/Psych Chronic back pain with numbness in left leg, on chronic narcotics.     GI/Hepatic Neg liver ROS, hiatal hernia, GERD-  Medicated and Controlled,  Endo/Other  negative endocrine ROS  Renal/GU negative Renal ROS  negative genitourinary   Musculoskeletal negative musculoskeletal ROS (+)   Abdominal   Peds  Hematology negative hematology ROS (+)   Anesthesia Other Findings Healed trach site, no stridor  Reproductive/Obstetrics                         Anesthesia Physical Anesthesia Plan  ASA: III  Anesthesia Plan: General   Post-op Pain Management:    Induction: Intravenous  Airway Management Planned: Oral ETT  Additional Equipment: None  Intra-op Plan:   Post-operative Plan: Extubation in OR  Informed Consent: I have reviewed the patients History and Physical, chart, labs and discussed the procedure including the risks, benefits and alternatives for the proposed anesthesia with the patient or authorized representative who has indicated his/her understanding and acceptance.   Dental advisory given  Plan Discussed with: CRNA and  Surgeon  Anesthesia Plan Comments:         Anesthesia Quick Evaluation

## 2013-08-13 NOTE — Preoperative (Signed)
Beta Blockers   Reason not to administer Beta Blockers:Not Applicable 

## 2013-08-13 NOTE — Anesthesia Procedure Notes (Signed)
Procedure Name: Intubation Date/Time: 08/13/2013 3:51 PM Performed by: Terrill Mohr Pre-anesthesia Checklist: Patient identified, Emergency Drugs available, Suction available and Patient being monitored Patient Re-evaluated:Patient Re-evaluated prior to inductionOxygen Delivery Method: Circle system utilized Preoxygenation: Pre-oxygenation with 100% oxygen Intubation Type: IV induction Ventilation: Mask ventilation without difficulty Laryngoscope Size: Mac and 4 Grade View: Grade II Tube type: Oral Tube size: 7.5 mm Number of attempts: 1 Airway Equipment and Method: Stylet Placement Confirmation: ETT inserted through vocal cords under direct vision,  breath sounds checked- equal and bilateral and positive ETCO2 Secured at: 23 (cm at teeth) cm Tube secured with: Tape Dental Injury: Teeth and Oropharynx as per pre-operative assessment

## 2013-08-13 NOTE — H&P (Signed)
PREOPERATIVE H&P  Chief Complaint: left leg pain  HPI: Terry Wolfe is a 53 y.o. male who presents for with ongoing leg leg pain. 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 Patient failed multiple conservative treatment measures and did elect to proceed with a decompressive procedure.  Past Medical History  Diagnosis Date  . Hypertension   . Hyperlipidemia   . Obesity   . ED (erectile dysfunction)   . Abscess     left thigh  . Epididymitis, right ~ 2009  . History of MRSA infection 03/2011; 11/15/11    left thigh; nasal swab  . Chronic bronchitis     "as a child"  . Pneumonia     "chronic as a child  . GERD (gastroesophageal reflux disease)   . H/O hiatal hernia   . Arthritis     "right knuckles"  . Enlarged prostate with lower urinary tract symptoms (LUTS)     "that's what gave me epididymitis"  . Sleep apnea     diagnosed thru sleep study 06/2013, CPAP ordered  . Complication of anesthesia   . PONV (postoperative nausea and vomiting)   . Angina     denied 08/02/13   Past Surgical History  Procedure Laterality Date  . Umbilical hernia repair  01/26/2004    Ventralex - Dr Rebekah [place]  . Staple hemorrhoidectomy  09/07/2008    [place] Dr Zettie Pho  . Incise and drain abcess  03/2011    left thigh  . Tracheostomy  1972    trach for 4 days as child for accident  . Hernia repair  2005  . Finger fracture surgery  09/28/2001    left middle finger  . Incision and drainage of wound  1990's; 11/15/11    right; left  . Appendectomy  1977  . I&d extremity  11/15/2011    Procedure: IRRIGATION AND DEBRIDEMENT EXTREMITY;  Surgeon: Mcarthur Rossetti, MD;  Location: [place];  Service: Orthopedics;  Laterality: Left;  Irrigation and debridement left thumb  . Pilonidal cyst excision  1980s    removed from lower back  . Anterior fusion cervical spine  04/14/2013    Dr. Lynann Bologna   History   Social History  . Marital Status: Married    Spouse Name: N/A    Number of Children: N/A  . Years of Education: N/A    Social History Main Topics  . Smoking status: Never Smoker   . Smokeless tobacco: Never Used  . Alcohol Use: Yes     Comment: 11/16/11 "mixed drink q once and awhile"  . Drug Use: No  . Sexual Activity: Yes   Other Topics Concern  . Not on file   Social History Narrative  . No narrative on file   No family history on file. No Known Allergies Prior to Admission medications   Medication Sig Start Date End Date Taking? Authorizing Provider  aspirin 81 MG chewable tablet Chew 81 mg by mouth daily.   Yes Historical Provider, MD  cyclobenzaprine (FLEXERIL) 5 MG tablet Take 5-10 mg by mouth 2 (two) times daily as needed for muscle spasms.   Yes Historical Provider, MD  diazepam (VALIUM) 5 MG tablet Take 5 mg by mouth 2 (two) times daily as needed for anxiety or muscle spasms.   Yes Historical Provider, MD  glucosamine-chondroitin 500-400 MG tablet Take 1 tablet by mouth 3 (three) times daily.     Yes Historical Provider, MD  HYDROcodone-acetaminophen (NORCO/VICODIN) 5-325 MG per tablet Take 1 tablet by mouth every 6 (  six) hours as needed for moderate pain.   Yes Historical Provider, MD  Multiple Vitamin (MULITIVITAMIN WITH MINERALS) TABS Take 1 tablet by mouth daily.   Yes Historical Provider, MD  Olmesartan-Amlodipine-HCTZ (TRIBENZOR) 20-5-12.5 MG TABS Take 2 tablets by mouth daily.   Yes Historical Provider, MD  rosuvastatin (CRESTOR) 10 MG tablet Take 10 mg by mouth daily.   Yes Historical Provider, MD  sildenafil (VIAGRA) 50 MG tablet Take 50 mg by mouth as needed. For erectile dysfunction   Yes Historical Provider, MD  Tamsulosin HCl (FLOMAX) 0.4 MG CAPS Take 0.4 mg by mouth daily as needed. For urges to urinate   Yes Historical Provider, MD  meloxicam (MOBIC) 15 MG tablet Take 15 mg by mouth daily.    Historical Provider, MD     All other systems have been reviewed and were otherwise negative with the exception of those mentioned in the HPI and as above.  Physical Exam: There were  no vitals filed for this visit.  General: Alert, no acute distress Cardiovascular: No pedal edema Respiratory: No cyanosis, no use of accessory musculature GI: No organomegaly, abdomen is soft and non-tender Skin: No lesions in the area of chief complaint Neurologic: Sensation intact distally Psychiatric: Patient is competent for consent with normal mood and affect Lymphatic: No axillary or cervical lymphadenopathy  MUSCULOSKELETAL: + SLR on left  Assessment/Plan: Left leg pain and numbness Plan for Procedure(s): DECOMPRESSIVE LUMBAR LAMINECTOMY LEVEL 2   Sinclair Ship, MD 08/13/2013 6:34 AM

## 2013-08-13 NOTE — Transfer of Care (Signed)
Immediate Anesthesia Transfer of Care Note  Patient: Terry Wolfe  Procedure(s) Performed: Procedure(s) with comments: DECOMPRESSIVE LUMBAR LAMINECTOMY LEVEL 2 (N/A) - Lumbar 4-5, lumbar 5-sacrum 1 decompression  Patient Location: PACU  Anesthesia Type:General  Level of Consciousness: sedated, patient cooperative and responds to stimulation  Airway & Oxygen Therapy: Patient Spontanous Breathing and Patient connected to nasal cannula oxygen  Post-op Assessment: Report given to PACU RN, Post -op Vital signs reviewed and stable, Patient moving all extremities and Patient moving all extremities X 4  Post vital signs: Reviewed and stable  Complications: No apparent anesthesia complications

## 2013-08-13 NOTE — Anesthesia Postprocedure Evaluation (Signed)
  Anesthesia Post-op Note  Patient: Terry Wolfe  Procedure(s) Performed: Procedure(s) with comments: DECOMPRESSIVE LUMBAR LAMINECTOMY LEVEL 2 (N/A) - Lumbar 4-5, lumbar 5-sacrum 1 decompression  Patient Location: PACU  Anesthesia Type:General  Level of Consciousness: awake and alert   Airway and Oxygen Therapy: Patient Spontanous Breathing  Post-op Pain: mild  Post-op Assessment: Post-op Vital signs reviewed, Patient's Cardiovascular Status Stable, Respiratory Function Stable, Patent Airway, No signs of Nausea or vomiting and Pain level controlled  Post-op Vital Signs: Reviewed and stable  Complications: No apparent anesthesia complications

## 2013-08-14 ENCOUNTER — Encounter (HOSPITAL_COMMUNITY): Payer: Self-pay | Admitting: Orthopedic Surgery

## 2013-08-14 NOTE — Op Note (Signed)
NAME:  Terry Wolfe, Terry Wolfe NO.:  [id number]  MEDICAL RECORD NO.:  [id number]  LOCATION:  5N04C                        FACILITY:  [facility]  PHYSICIAN:  Phylliss Bob, MD      DATE OF BIRTH:  April 14, 1961  DATE OF PROCEDURE:  08/13/2013                              OPERATIVE REPORT   POSTOPERATIVE DIAGNOSES:  L4-5, L5-S1 spinal stenosis.  POSTOPERATIVE DIAGNOSIS:  L4-5, L5-S1 spinal stenosis.  PROCEDURES:  L4-5, L5-S1 laminectomy with bilateral partial facetectomy and bilateral foraminotomy.  SURGEON:  Phylliss Bob, MD  ASSISTANT:  Pricilla Holm, PA-C  ANESTHESIA:  General endotracheal anesthesia.  COMPLICATIONS:  None.  DISPOSITION:  Stable.  ESTIMATED BLOOD LOSS:  100 mL.  INDICATIONS FOR PROCEDURE:  Briefly, [name] is a very pleasant 53- year-old male, who did initially present to me on March 09, 2013, with a chief complaint related to his back and his left leg in addition to his right leg.  Of note, an MRI of the patient's lumbar spine did reveal moderate-to-severe stenosis associated with the L4-5 and L5-S1 levels. Also of note, there was mild-to-moderate stenosis at L3-4 with a spondylolisthesis also noted at the L3-4 level.  The patient did fail multiple forms of conservative care.  We did discuss surgical options. We did elect to proceed with a decompression from L4-S1, as this was the most severe stenosis noted on his MRI.  A consideration was also made with decompressing the L3-4 level, however, given the instability associated with the L3-4, a fusion would also be indicated, we therefore did elect to proceed with a 2 level decompression.  If the patient were to have residual debilitating symptoms from the L3-4 level, a fusion would need to be discussed at a future date.  The patient did understand this.  OPERATIVE DETAILS:  On August 13, 2013, the patient was brought to surgery and general endotracheal anesthesia was administered.   The patient was placed prone on a well-padded flat Jackson bed with spinal frame.  Antibiotics were given and a time-out procedure was performed. The back was prepped and draped in usual sterile fashion.  I then made a midline incision from approximately spinous process of L4 to approximately spinous process of S1.  The paraspinal musculature was incised in the midline and bluntly swept laterally.  The lamina of L4, L5, and S1 were subperiosteally exposed.  A lateral intraoperative radiograph to confirm the appropriate operative levels.  I then removed the spinous processes of L5 and L4.  I then used a high-speed bur to remove the central aspect of the L4 and L5 lamina.  I then used a series of Kerrison punches to perform a central decompression.  A lateral recess decompression was next performed on the left and then on the right side.  A thorough neuroforaminal decompression was then performed bilaterally at L4-5 and L5-S1.  At the termination of the decompression, I was easily able to palpate the pedicles of L4, L5, and S1.  I was easily able pass a Barbra Sarks out the neural foramina bilaterally as well.  I then copiously irrigated the wound.  There was no extravasation of cerebrospinal fluid noted throughout the entire procedure.  I then controlled all epidural bleeding using bipolar electrocautery in addition to Surgiflo.  There was a very minor epidural bleeding noted at the termination of the procedure, therefore, a deep Blake drain was placed.  I then closed the fascia using #1 Vicryl.  The subcutaneous layer was closed using 2-0 Vicryl, and the skin was closed using 3-0 Monocryl.  At the termination of the procedure, all instrument counts were correct.  Of note, Pricilla Holm was my assistant throughout the entirety of the procedure, and did aid in essential retraction and suctioning needed throughout the surgery.     Phylliss Bob, MD     MD/MEDQ  D:  08/13/2013  T:   08/14/2013  Job:  [id number]  cc:   Serita Grammes, C.N.M.

## 2013-08-14 NOTE — Progress Notes (Signed)
Called report to [facility]

## 2013-08-14 NOTE — Progress Notes (Signed)
Patient doing well overall. C/o intermittent left calf pain. Has not been up thus far.  BP 110/51  Pulse 90  Temp(Src) 98.3 F (36.8 C) (Oral)  Resp 16  SpO2 94%  Dressing in place. NVI  POD #1 after L4-S1 decompression, doing well  - I discussed with patient that leg pain may be secondary to cramping vs. Stenosis at L3/4, which may need to be discussed at a future date in the office. Plan for now is to observe. - OOB this morning and d/c early afternoon - nursing to d/c drain prior to d/c - f/u 2 weeks

## 2013-08-14 NOTE — Progress Notes (Signed)
Pt stated that gradually over the night his right arm has been feeling weird. It is not moving exactly how it was before. No numbness or tingling present. Senses intact. Right hand slightly weaker than left. Pulse 2+. Will defer to day shift and continue to monitor.

## 2014-03-30 ENCOUNTER — Encounter: Payer: BC Managed Care – PPO | Attending: Family Medicine

## 2014-03-30 VITALS — Ht 70.0 in | Wt 306.0 lb

## 2014-03-30 DIAGNOSIS — Z713 Dietary counseling and surveillance: Secondary | ICD-10-CM | POA: Diagnosis not present

## 2014-03-30 DIAGNOSIS — E119 Type 2 diabetes mellitus without complications: Secondary | ICD-10-CM | POA: Diagnosis present

## 2014-04-01 NOTE — Progress Notes (Signed)
Patient was seen on 03/30/14 for the first of a series of three diabetes self-management courses at the [facility].  Patient Education Plan per assessed needs and concerns is to attend four course education program for Diabetes Self Management Education.  Current HbA1c: 6.5%  The following learning objectives were met by the patient during this class:  Describe diabetes  State some common risk factors for diabetes  Defines the role of glucose and insulin  Identifies type of diabetes and pathophysiology  Describe the relationship between diabetes and cardiovascular risk  State the members of the Healthcare Team  States the rationale for glucose monitoring  State when to test glucose  State their individual Target Range  State the importance of logging glucose readings  Describe how to interpret glucose readings  Identifies A1C target  Explain the correlation between A1c and eAG values  State symptoms and treatment of high blood glucose  State symptoms and treatment of low blood glucose  Explain proper technique for glucose testing  Identifies proper sharps disposal  Handouts given during class include:  Living Well with Diabetes book  Carb Counting and Meal Planning book  Meal Plan Card  Carbohydrate guide  Meal planning worksheet  Low Sodium Flavoring Tips  The diabetes portion plate  Z5M to eAG Conversion Chart  Diabetes Medications  Diabetes Recommended Care Schedule  Support Group  Diabetes Success Plan  Core Class Satisfaction Survey  Follow-Up Plan:  Attend core 2

## 2014-04-06 DIAGNOSIS — E119 Type 2 diabetes mellitus without complications: Secondary | ICD-10-CM

## 2014-04-06 NOTE — Progress Notes (Signed)

## 2014-04-13 DIAGNOSIS — E119 Type 2 diabetes mellitus without complications: Secondary | ICD-10-CM

## 2014-04-13 NOTE — Progress Notes (Signed)
Patient was seen on 04/13/2014 for the third of a series of three diabetes self-management courses at the [facility]. The following learning objectives were met by the patient during this class:    State the amount of activity recommended for healthy living   Describe activities suitable for individual needs   Identify ways to regularly incorporate activity into daily life   Identify barriers to activity and ways to over come these barriers  Identify diabetes medications being personally used and their primary action for lowering glucose and possible side effects   Describe role of stress on blood glucose and develop strategies to address psychosocial issues   Identify diabetes complications and ways to prevent them  Explain how to manage diabetes during illness   Evaluate success in meeting personal goal   Establish 2-3 goals that they will plan to diligently work on until they return for the  62-monthfollow-up visit  Goals:   I will count my carb choices at most meals and snacks  I will take my diabetes medications as scheduled  I will test my glucose at least 2 times a day, 7 days a week  Your patient has identified these potential barriers to change:  Motivation  Your patient has identified their diabetes self-care support plan as  Family Support  Plan:  Attend Core 4 in 4 months

## 2014-07-01 ENCOUNTER — Encounter (HOSPITAL_COMMUNITY): Payer: Self-pay | Admitting: Physical Medicine and Rehabilitation

## 2014-07-01 ENCOUNTER — Emergency Department (HOSPITAL_COMMUNITY)
Admission: EM | Admit: 2014-07-01 | Discharge: 2014-07-01 | Disposition: A | Discharge status: Home / Self Care | Payer: BC Managed Care – PPO | Attending: Emergency Medicine | Admitting: Emergency Medicine

## 2014-07-01 DIAGNOSIS — Z79899 Other long term (current) drug therapy: Secondary | ICD-10-CM | POA: Insufficient documentation

## 2014-07-01 DIAGNOSIS — Z791 Long term (current) use of non-steroidal anti-inflammatories (NSAID): Secondary | ICD-10-CM | POA: Insufficient documentation

## 2014-07-01 DIAGNOSIS — Z9981 Dependence on supplemental oxygen: Secondary | ICD-10-CM | POA: Diagnosis not present

## 2014-07-01 DIAGNOSIS — Z8709 Personal history of other diseases of the respiratory system: Secondary | ICD-10-CM | POA: Diagnosis not present

## 2014-07-01 DIAGNOSIS — W57XXXA Bitten or stung by nonvenomous insect and other nonvenomous arthropods, initial encounter: Secondary | ICD-10-CM | POA: Diagnosis not present

## 2014-07-01 DIAGNOSIS — Z8719 Personal history of other diseases of the digestive system: Secondary | ICD-10-CM | POA: Diagnosis not present

## 2014-07-01 DIAGNOSIS — E119 Type 2 diabetes mellitus without complications: Secondary | ICD-10-CM | POA: Diagnosis not present

## 2014-07-01 DIAGNOSIS — E669 Obesity, unspecified: Secondary | ICD-10-CM | POA: Insufficient documentation

## 2014-07-01 DIAGNOSIS — L509 Urticaria, unspecified: Secondary | ICD-10-CM | POA: Insufficient documentation

## 2014-07-01 DIAGNOSIS — Z7982 Long term (current) use of aspirin: Secondary | ICD-10-CM | POA: Diagnosis not present

## 2014-07-01 DIAGNOSIS — Z8701 Personal history of pneumonia (recurrent): Secondary | ICD-10-CM | POA: Diagnosis not present

## 2014-07-01 DIAGNOSIS — Z8614 Personal history of Methicillin resistant Staphylococcus aureus infection: Secondary | ICD-10-CM | POA: Diagnosis not present

## 2014-07-01 DIAGNOSIS — Y92009 Unspecified place in unspecified non-institutional (private) residence as the place of occurrence of the external cause: Secondary | ICD-10-CM | POA: Diagnosis not present

## 2014-07-01 DIAGNOSIS — G4730 Sleep apnea, unspecified: Secondary | ICD-10-CM | POA: Diagnosis not present

## 2014-07-01 DIAGNOSIS — I10 Essential (primary) hypertension: Secondary | ICD-10-CM | POA: Insufficient documentation

## 2014-07-01 DIAGNOSIS — Z8619 Personal history of other infectious and parasitic diseases: Secondary | ICD-10-CM | POA: Insufficient documentation

## 2014-07-01 DIAGNOSIS — Y93E5 Activity, floor mopping and cleaning: Secondary | ICD-10-CM | POA: Diagnosis not present

## 2014-07-01 DIAGNOSIS — S80861A Insect bite (nonvenomous), right lower leg, initial encounter: Secondary | ICD-10-CM | POA: Diagnosis present

## 2014-07-01 DIAGNOSIS — Y998 Other external cause status: Secondary | ICD-10-CM | POA: Insufficient documentation

## 2014-07-01 DIAGNOSIS — N529 Male erectile dysfunction, unspecified: Secondary | ICD-10-CM | POA: Insufficient documentation

## 2014-07-01 DIAGNOSIS — E785 Hyperlipidemia, unspecified: Secondary | ICD-10-CM | POA: Diagnosis not present

## 2014-07-01 MED ORDER — HYDROCORTISONE 2.5 % EX CREA: TOPICAL_CREAM | Freq: Two times a day (BID) | CUTANEOUS | Status: DC | PRN

## 2014-07-01 MED ORDER — HYDROXYZINE HCL 25 MG PO TABS: 25.0000 mg | ORAL_TABLET | Freq: Four times a day (QID) | ORAL | Status: DC | PRN
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
## 2014-07-01 NOTE — ED Notes (Signed)
Pt presents to department for evaluation of possible insect bites to R lower leg. Pt states he has been cleaning out of house with spiders and insects. Small red areas noted to R lower leg, states areas are very itchy and uncomfortable. No signs of distress noted.

## 2014-07-01 NOTE — Discharge Instructions (Signed)
Take vistaril and hydrocortisone cream as prescribed. Continue your usual home medications. Get plenty of rest and drink plenty of fluids. Avoid any known triggers, wash all clothes in hot water and change the sheets. Spray for insects as needed in the house. Please followup with your primary doctor for discussion of your diagnoses and further evaluation after today's visit, within the next 5 days. Return to the ER for changes or worsening symptoms   Insect Bite Mosquitoes, flies, fleas, bedbugs, and many other insects can bite. Insect bites are different from insect stings. A sting is when venom is injected into the skin. Some insect bites can transmit infectious diseases. SYMPTOMS  Insect bites usually turn red, swell, and itch for 2 to 4 days. They often go away on their own. TREATMENT  Your caregiver may prescribe antibiotic medicines if a bacterial infection develops in the bite. HOME CARE INSTRUCTIONS  Do not scratch the bite area.  Keep the bite area clean and dry. Wash the bite area thoroughly with soap and water.  Put ice or cool compresses on the bite area.  Put ice in a plastic bag.  Place a towel between your skin and the bag.  Leave the ice on for 20 minutes, 4 times a day for the first 2 to 3 days, or as directed.  You may apply a baking soda paste, cortisone cream, or calamine lotion to the bite area as directed by your caregiver. This can help reduce itching and swelling.  Only take over-the-counter or prescription medicines as directed by your caregiver.  If you are given antibiotics, take them as directed. Finish them even if you start to feel better. You may need a tetanus shot if:  You cannot remember when you had your last tetanus shot.  You have never had a tetanus shot.  The injury broke your skin. If you get a tetanus shot, your arm may swell, get red, and feel warm to the touch. This is common and not a problem. If you need a tetanus shot and you choose not  to have one, there is a rare chance of getting tetanus. Sickness from tetanus can be serious. SEEK IMMEDIATE MEDICAL CARE IF:   You have increased pain, redness, or swelling in the bite area.  You see a red line on the skin coming from the bite.  You have a fever.  You have joint pain.  You have a headache or neck pain.  You have unusual weakness.  You have a rash.  You have chest pain or shortness of breath.  You have abdominal pain, nausea, or vomiting.  You feel unusually tired or sleepy. MAKE SURE YOU:   Understand these instructions.  Will watch your condition.  Will get help right away if you are not doing well or get worse. Document Released: 08/09/2004 Document Revised: 09/24/2011 Document Reviewed: 01/31/2011 [facility] Patient Information 2015 [place], [place]. This information is not intended to replace advice given to you by your health care provider. Make sure you discuss any questions you have with your health care provider.  Hives Hives are itchy, red, swollen areas of the skin. They can vary in size and location on your body. Hives can come and go for hours or several days (acute hives) or for several weeks (chronic hives). Hives do not spread from person to person (noncontagious). They may get worse with scratching, exercise, and emotional stress. CAUSES   Allergic reaction to food, additives, or drugs.  Infections, including the common cold.  Illness, such as vasculitis, lupus, or thyroid disease.  Exposure to sunlight, heat, or cold.  Exercise.  Stress.  Contact with chemicals. SYMPTOMS   Red or white swollen patches on the skin. The patches may change size, shape, and location quickly and repeatedly.  Itching.  Swelling of the hands, feet, and face. This may occur if hives develop deeper in the skin. DIAGNOSIS  Your caregiver can usually tell what is wrong by performing a physical exam. Skin or blood tests may also be done to determine the  cause of your hives. In some cases, the cause cannot be determined. TREATMENT  Mild cases usually get better with medicines such as antihistamines. Severe cases may require an emergency epinephrine injection. If the cause of your hives is known, treatment includes avoiding that trigger.  HOME CARE INSTRUCTIONS   Avoid causes that trigger your hives.  Take antihistamines as directed by your caregiver to reduce the severity of your hives. Non-sedating or low-sedating antihistamines are usually recommended. Do not drive while taking an antihistamine.  Take any other medicines prescribed for itching as directed by your caregiver.  Wear loose-fitting clothing.  Keep all follow-up appointments as directed by your caregiver. SEEK MEDICAL CARE IF:   You have persistent or severe itching that is not relieved with medicine.  You have painful or swollen joints. SEEK IMMEDIATE MEDICAL CARE IF:   You have a fever.  Your tongue or lips are swollen.  You have trouble breathing or swallowing.  You feel tightness in the throat or chest.  You have abdominal pain. These problems may be the first sign of a life-threatening allergic reaction. Call your local emergency services (911 in [place] MAKE SURE YOU:   Understand these instructions.  Will watch your condition.  Will get help right away if you are not doing well or get worse. Document Released: 07/02/2005 Document Revised: 07/07/2013 Document Reviewed: 09/25/2011 [facility] Patient Information 2015 [place], [place]. This information is not intended to replace advice given to you by your health care provider. Make sure you discuss any questions you have with your health care provider.

## 2014-07-19 ENCOUNTER — Encounter: Payer: BLUE CROSS/BLUE SHIELD | Attending: Family Medicine

## 2014-07-19 DIAGNOSIS — E119 Type 2 diabetes mellitus without complications: Secondary | ICD-10-CM | POA: Insufficient documentation

## 2014-07-19 DIAGNOSIS — Z713 Dietary counseling and surveillance: Secondary | ICD-10-CM | POA: Insufficient documentation

## 2014-07-20 NOTE — Progress Notes (Signed)
Appt start time: 0900 end time:  1000.  Patient was seen on 07/19/14 for a review of the series of three diabetes self-management courses at the [facility]. The following learning objectives were met by the patient during this class:  . Reviewed blood glucose monitoring and interpretation including the recommended target ranges and Hgb A1c.  . Reviewed on carb counting, importance of regularly scheduled meals/snacks, and meal planning.  . Reviewed the effects of physical activity on glucose levels and long-term glucose control.  Recommended goal of 150 minutes of physical activity/week. . Reviewed patient medications and discussed role of medication on blood glucose and possible side effects. . Discussed strategies to manage stress, psychosocial issues, and other obstacles to diabetes management. . Encouraged moderate weight reduction to improve glucose levels.   . Reviewed short-term complications: hyper- and hypo-glycemia.  Discussed causes, symptoms, and treatment options. . Reviewed prevention, detection, and treatment of long-term complications.  Discussed the role of prolonged elevated glucose levels on body systems.  Goals:  Follow Diabetes Meal Plan as instructed  Eat 3 meals and 2 snacks, every 3-5 hrs  Limit carbohydrate intake to 45 grams carbohydrate/meal Limit carbohydrate intake to 15 grams carbohydrate/snack Add lean protein foods to meals/snacks  Monitor glucose levels as instructed by your doctor  Aim for goal of 15-30 mins of physical activity daily as tolerated  Bring food record and glucose log to your next nutrition visit

## 2015-01-13 IMAGING — CR DG CHEST 2V
2 series · 2 of 2 positions shown · non-contrast
Comparison: DG CHEST 1V PORT dated 11/15/2011

CLINICAL DATA: Hypertension, sleep apnea, preop evaluation

EXAM:
CHEST  2 VIEW

[w chest pa]
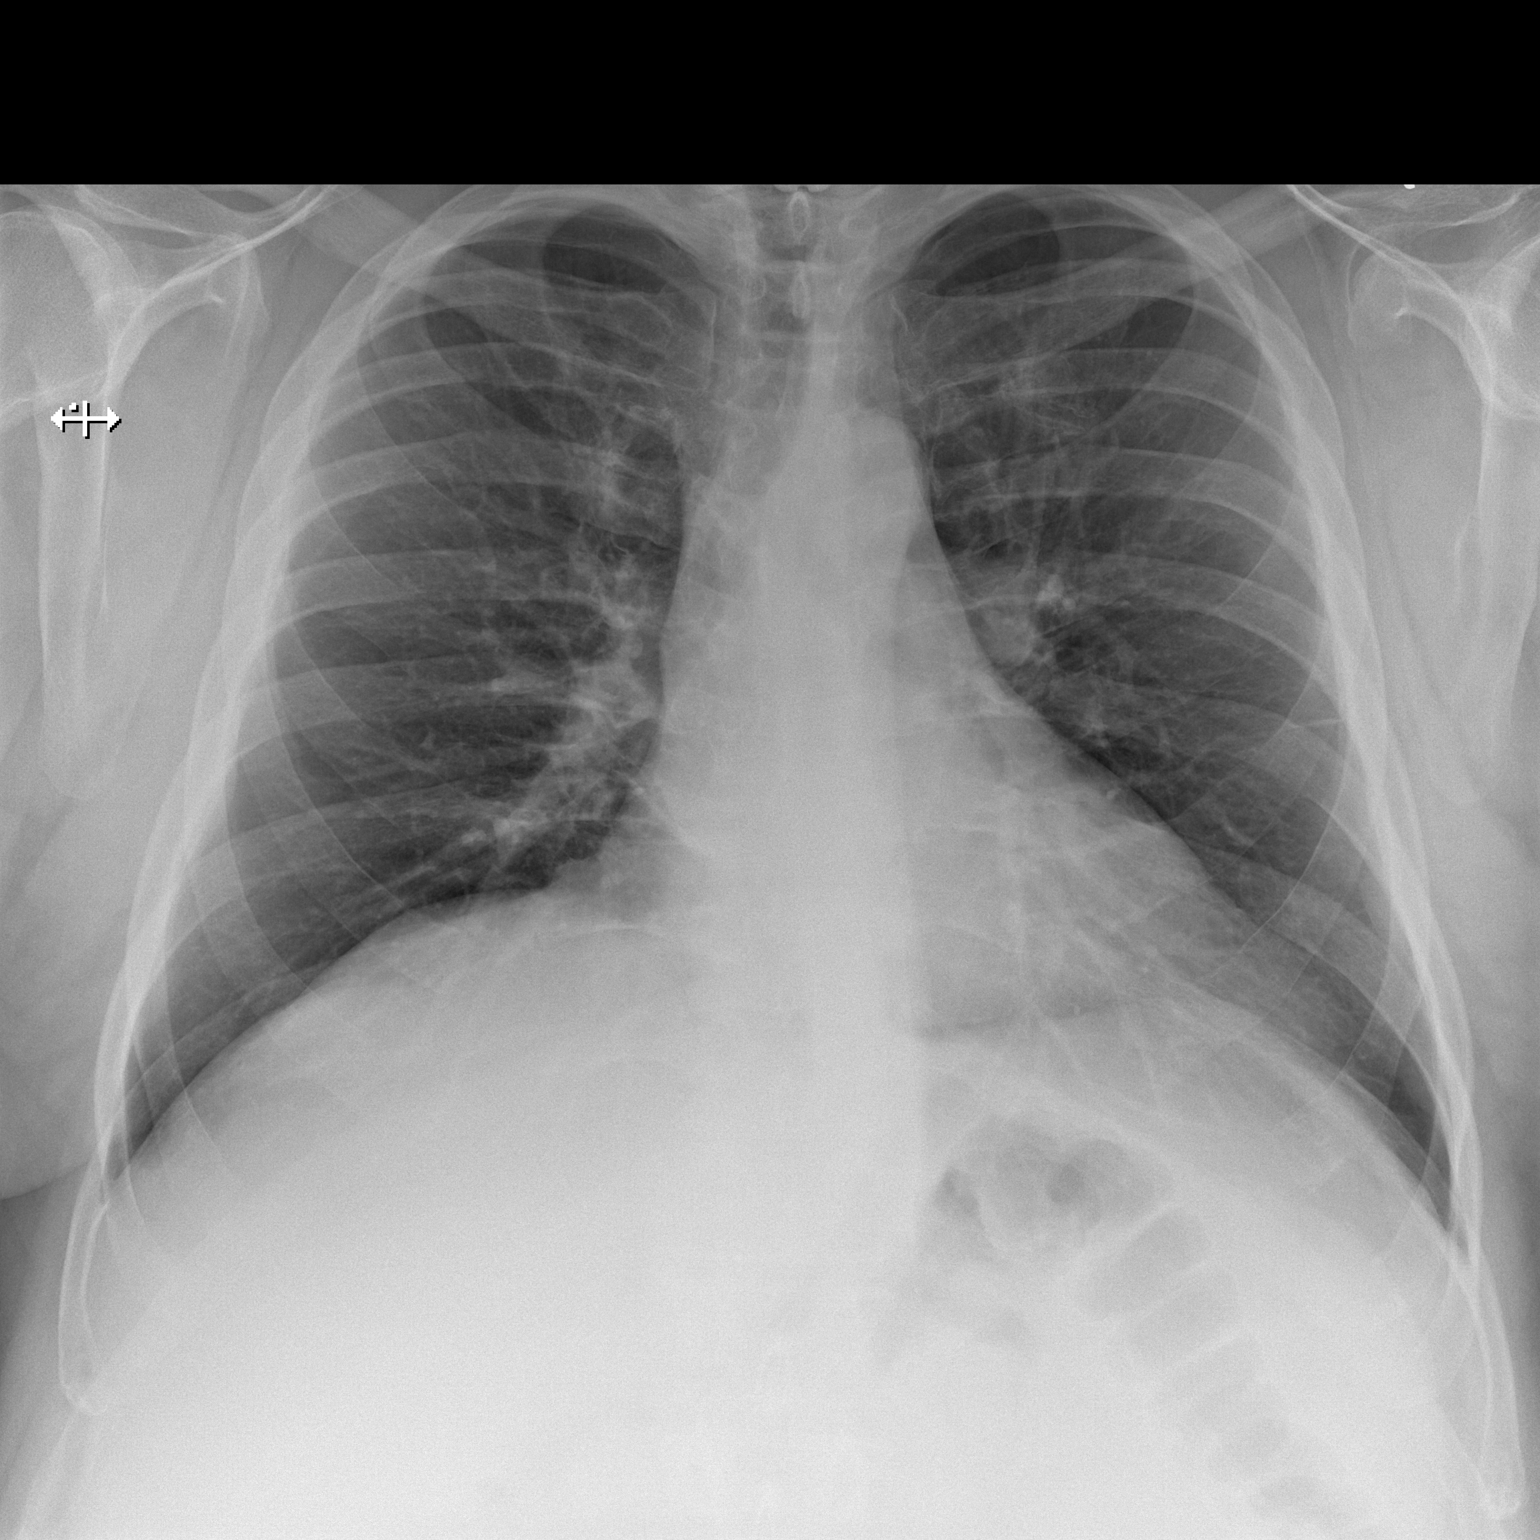

[w chest lat]
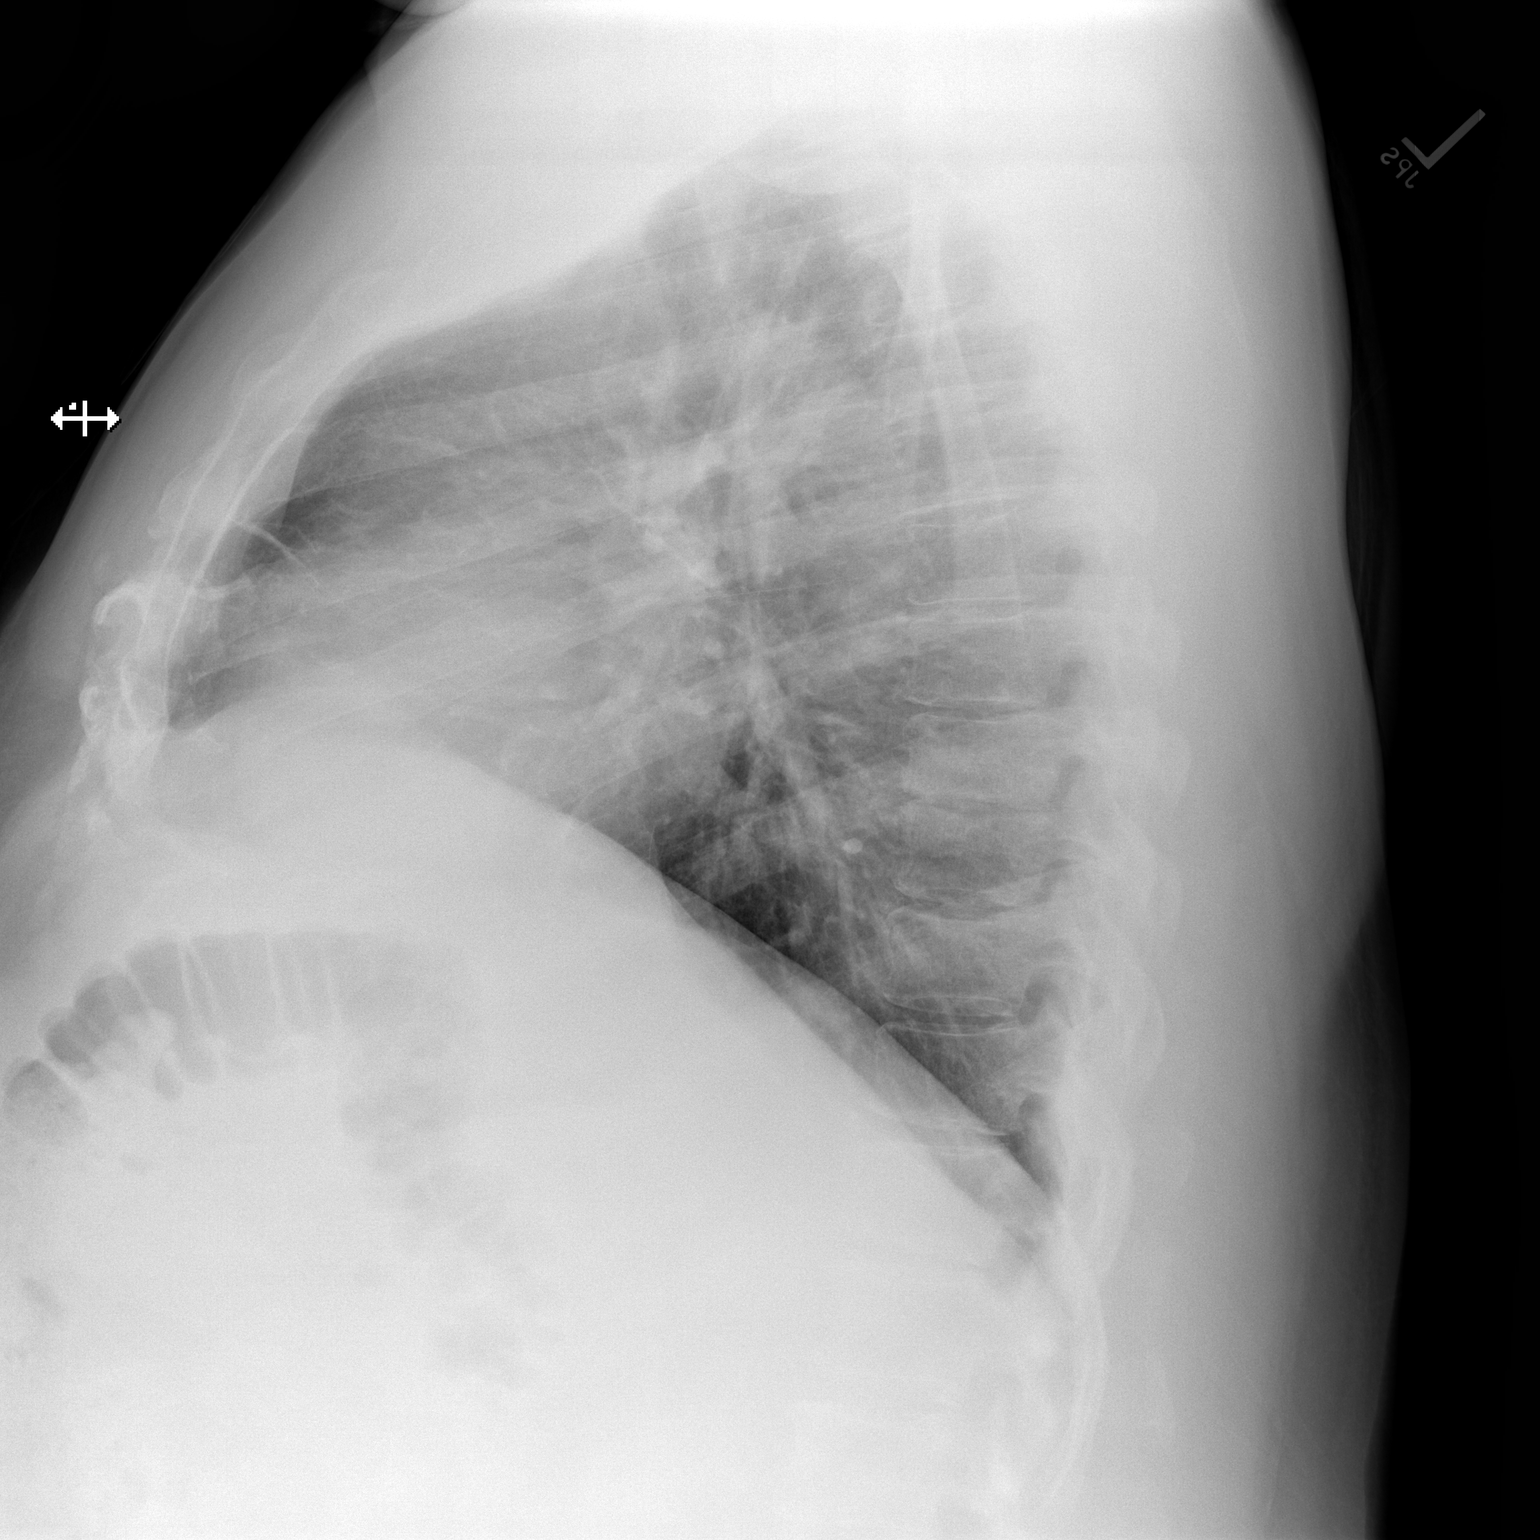

[2 of 2 positions shown; findings below may reference images not displayed]

FINDINGS: The heart size and mediastinal contours are within normal limits.
Both lungs are clear. The visualized skeletal structures are
unremarkable.
IMPRESSION: No active cardiopulmonary disease.

## 2015-01-27 ENCOUNTER — Other Ambulatory Visit: Payer: Self-pay | Admitting: Orthopedic Surgery

## 2015-02-04 ENCOUNTER — Other Ambulatory Visit (HOSPITAL_COMMUNITY): Payer: Self-pay

## 2015-02-14 ENCOUNTER — Encounter (HOSPITAL_COMMUNITY): Payer: Self-pay

## 2015-02-14 ENCOUNTER — Ambulatory Visit (HOSPITAL_COMMUNITY)
Admission: RE | Admit: 2015-02-14 | Discharge: 2015-02-14 | Disposition: A | Discharge status: Home / Self Care | Payer: BLUE CROSS/BLUE SHIELD | Source: Ambulatory Visit | Attending: Orthopedic Surgery | Admitting: Orthopedic Surgery

## 2015-02-14 ENCOUNTER — Encounter (HOSPITAL_COMMUNITY)
Admission: RE | Admit: 2015-02-14 | Discharge: 2015-02-14 | Disposition: A | Discharge status: Home / Self Care | Payer: BLUE CROSS/BLUE SHIELD | Source: Ambulatory Visit | Attending: Orthopedic Surgery | Admitting: Orthopedic Surgery

## 2015-02-14 DIAGNOSIS — Z981 Arthrodesis status: Secondary | ICD-10-CM | POA: Diagnosis not present

## 2015-02-14 DIAGNOSIS — Z01818 Encounter for other preprocedural examination: Secondary | ICD-10-CM

## 2015-02-14 DIAGNOSIS — Z79899 Other long term (current) drug therapy: Secondary | ICD-10-CM | POA: Diagnosis not present

## 2015-02-14 DIAGNOSIS — Z0183 Encounter for blood typing: Secondary | ICD-10-CM | POA: Insufficient documentation

## 2015-02-14 DIAGNOSIS — N400 Enlarged prostate without lower urinary tract symptoms: Secondary | ICD-10-CM | POA: Diagnosis not present

## 2015-02-14 DIAGNOSIS — Z01812 Encounter for preprocedural laboratory examination: Secondary | ICD-10-CM | POA: Insufficient documentation

## 2015-02-14 DIAGNOSIS — G4733 Obstructive sleep apnea (adult) (pediatric): Secondary | ICD-10-CM | POA: Insufficient documentation

## 2015-02-14 DIAGNOSIS — I10 Essential (primary) hypertension: Secondary | ICD-10-CM | POA: Diagnosis not present

## 2015-02-14 DIAGNOSIS — E119 Type 2 diabetes mellitus without complications: Secondary | ICD-10-CM | POA: Diagnosis not present

## 2015-02-14 DIAGNOSIS — R9431 Abnormal electrocardiogram [ECG] [EKG]: Secondary | ICD-10-CM | POA: Insufficient documentation

## 2015-02-14 DIAGNOSIS — K219 Gastro-esophageal reflux disease without esophagitis: Secondary | ICD-10-CM | POA: Insufficient documentation

## 2015-02-14 DIAGNOSIS — Z7982 Long term (current) use of aspirin: Secondary | ICD-10-CM | POA: Diagnosis not present

## 2015-02-14 HISTORY — DX: Reserved for inherently not codable concepts without codable children: IMO0001

## 2015-02-14 HISTORY — DX: Diarrhea, unspecified: R19.7

## 2015-02-14 HISTORY — DX: Bronchitis, not specified as acute or chronic: J40

## 2015-02-14 HISTORY — DX: Constipation, unspecified: K59.00

## 2015-02-14 LAB — COMPREHENSIVE METABOLIC PANEL
ALBUMIN: 4 g/dL (ref 3.5–5.0)
ALK PHOS: 81 U/L (ref 38–126)
ALT: 21 U/L (ref 17–63)
AST: 24 U/L (ref 15–41)
Anion gap: 10 (ref 5–15)
BUN: 9 mg/dL (ref 6–20)
CHLORIDE: 102 mmol/L (ref 101–111)
CO2: 27 mmol/L (ref 22–32)
Calcium: 9.1 mg/dL (ref 8.9–10.3)
Creatinine, Ser: 0.97 mg/dL (ref 0.61–1.24)
GFR calc Af Amer: >60 mL/min (ref >60)
GFR calc non Af Amer: >60 mL/min (ref >60)
Glucose, Bld: 119 mg/dL — ABNORMAL HIGH (ref 65–99)
Potassium: 3 mmol/L — ABNORMAL LOW (ref 3.5–5.1)
Sodium: 139 mmol/L (ref 135–145)
TOTAL PROTEIN: 7 g/dL (ref 6.5–8.1)
Total Bilirubin: 0.2 mg/dL — ABNORMAL LOW (ref 0.3–1.2)

## 2015-02-14 LAB — PROTIME-INR
INR: 0.99 (ref 0.00–1.49)
Prothrombin Time: 13.3 seconds (ref 11.6–15.2)

## 2015-02-14 LAB — CBC WITH DIFFERENTIAL/PLATELET
BASOS ABS: 0.1 10*3/uL (ref 0.0–0.1)
BASOS PCT: 1 % (ref 0–1)
Eosinophils Absolute: 0.2 10*3/uL (ref 0.0–0.7)
Eosinophils Relative: 2 % (ref 0–5)
HCT: 42.7 % (ref 39.0–52.0)
Hemoglobin: 14.3 g/dL (ref 13.0–17.0)
LYMPHS ABS: 3.9 10*3/uL (ref 0.7–4.0)
Lymphocytes Relative: 39 % (ref 12–46)
MCH: 28.2 pg (ref 26.0–34.0)
MCHC: 33.5 g/dL (ref 30.0–36.0)
MCV: 84.2 fL (ref 78.0–100.0)
MONOS PCT: 5 % (ref 3–12)
Monocytes Absolute: 0.5 10*3/uL (ref 0.1–1.0)
Neutro Abs: 5.4 10*3/uL (ref 1.7–7.7)
Neutrophils Relative %: 53 % (ref 43–77)
Platelets: 257 10*3/uL (ref 150–400)
RBC: 5.07 MIL/uL (ref 4.22–5.81)
RDW: 14 % (ref 11.5–15.5)
WBC: 10 10*3/uL (ref 4.0–10.5)

## 2015-02-14 LAB — URINALYSIS, ROUTINE W REFLEX MICROSCOPIC
BILIRUBIN URINE: NEGATIVE
GLUCOSE, UA: NEGATIVE mg/dL
Hgb urine dipstick: NEGATIVE
Ketones, ur: NEGATIVE mg/dL
Leukocytes, UA: NEGATIVE
Nitrite: NEGATIVE
Protein, ur: NEGATIVE mg/dL
Specific Gravity, Urine: 1.01 (ref 1.005–1.030)
Urobilinogen, UA: 0.2 mg/dL (ref 0.0–1.0)
pH: 7 (ref 5.0–8.0)

## 2015-02-14 LAB — TYPE AND SCREEN
ABO/RH(D): O POS
ANTIBODY SCREEN: NEGATIVE

## 2015-02-14 LAB — SURGICAL PCR SCREEN
MRSA, PCR: NEGATIVE
Staphylococcus aureus: NEGATIVE

## 2015-02-14 LAB — APTT: aPTT: 28 seconds (ref 24–37)

## 2015-02-14 LAB — GLUCOSE, CAPILLARY: GLUCOSE-CAPILLARY: 110 mg/dL — AB (ref 65–99)

## 2015-02-14 NOTE — Pre-Procedure Instructions (Signed)
ALCARIO TINKEY  02/14/2015     Your procedure is scheduled on : Monday February 21, 2015 at 12:30 PM.  Report to [facility] Admitting at 10:30 A.M.  Call this number if you have problems the morning of surgery: [phone]   Remember:  Do not eat food or drink liquids after midnight.  Take these medicines the morning of surgery with A SIP OF WATER : Nebivolol (Bystolic), Tamsulosin (Flomax)   Stop taking any vitamins, herbal medications, Glucosamine, Meloxicam/Mobic, Advil, Ibuprofen, etc   Bring CPAP mask the day of surgery   Do not wear jewelry.  Do not wear lotions, powders, or cologne.    Men may shave face and neck.  Do not bring valuables to the hospital.  [facility] is not responsible for any belongings or valuables.  Contacts, dentures or bridgework may not be worn into surgery.  Leave your suitcase in the car.  After surgery it may be brought to your room.  For patients admitted to the hospital, discharge time will be determined by your treatment team.  Patients discharged the day of surgery will not be allowed to drive home.   Name and phone number of your driver:    Special instructions:  Shower using CHG soap the night before and the morning of your surgery  Please read over the following fact sheets that you were given. Pain Booklet, Coughing and Deep Breathing, Blood Transfusion Information, Total Joint Packet, MRSA Information and Surgical Site Infection Prevention

## 2015-02-14 NOTE — Progress Notes (Signed)
PCP is Mayra Neer, and Dr. Rana Snare treats patient for erectile dysfunction.   Patient denied having any acute cardiac or pulmonary issues.   Nurse inquired about blood glucose and patient informed Nurse that he does not check his blood sugars regularly, but when he does, the highest it has been was 135 and the lowest was 97.

## 2015-02-15 LAB — HEMOGLOBIN A1C
HEMOGLOBIN A1C: 6.4 % — AB (ref 4.8–5.6)
Mean Plasma Glucose: 137 mg/dL

## 2015-02-15 NOTE — Progress Notes (Signed)
Anesthesia Chart Review: Patient is a 54 year old male scheduled for right TKA on 02/21/15 by Dr. Berenice Primas.  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 BMI is consistent with morbid obesity. PCP is Dr. Mayra Neer. Urologist is Dr. Risa Grill.  Meds include ASA 81mg , Trulicity, lydroxyzine, Nasonex, Bystolic, Tribenzor, Percocet, Crestor, Viagra, Flomax.   02/14/15 EKG: NSR, minimal voltage criteria for LVH, late R wave transition--cannot rule out anterior infarct (age undetermined). No significant change since 11/15/11 and 08/11/13 EKGs.   02/14/15 CXR: IMPRESSION: No active disease. No change from priors.  Preoperative labs noted. A1C 6.4.   Patient's EKG is stable since 2013. No CV symptoms documented from his PAT visit. If no acute changes then I anticipate that he can proceed as planned.   George Hugh [facility] Short Stay Center/Anesthesiology Phone [phone] 02/15/2015 1:41 PM

## 2015-02-20 MED ORDER — DEXTROSE 5 % IV SOLN
3.0000 g | INTRAVENOUS | Status: AC
  Administered 2015-02-21: 3 g via INTRAVENOUS
  Filled 2015-02-20 (×2): qty 3000

## 2015-02-21 ENCOUNTER — Encounter (HOSPITAL_COMMUNITY)
Admission: RE | Disposition: A | Discharge status: Home Health | Payer: Self-pay | Source: Ambulatory Visit | Attending: Orthopedic Surgery

## 2015-02-21 ENCOUNTER — Encounter (HOSPITAL_COMMUNITY): Payer: Self-pay | Admitting: *Deleted

## 2015-02-21 ENCOUNTER — Inpatient Hospital Stay (HOSPITAL_COMMUNITY): Payer: BLUE CROSS/BLUE SHIELD | Admitting: Anesthesiology

## 2015-02-21 ENCOUNTER — Inpatient Hospital Stay (HOSPITAL_COMMUNITY): Payer: BLUE CROSS/BLUE SHIELD | Admitting: Vascular Surgery

## 2015-02-21 ENCOUNTER — Inpatient Hospital Stay (HOSPITAL_COMMUNITY)
Admission: RE | Admit: 2015-02-21 | Discharge: 2015-02-23 | DRG: 470 | Disposition: A | Discharge status: Home Health | Payer: BLUE CROSS/BLUE SHIELD | Source: Ambulatory Visit | Attending: Orthopedic Surgery | Admitting: Orthopedic Surgery

## 2015-02-21 DIAGNOSIS — Z7982 Long term (current) use of aspirin: Secondary | ICD-10-CM | POA: Diagnosis not present

## 2015-02-21 DIAGNOSIS — N401 Enlarged prostate with lower urinary tract symptoms: Secondary | ICD-10-CM | POA: Diagnosis present

## 2015-02-21 DIAGNOSIS — Z791 Long term (current) use of non-steroidal anti-inflammatories (NSAID): Secondary | ICD-10-CM

## 2015-02-21 DIAGNOSIS — Z79899 Other long term (current) drug therapy: Secondary | ICD-10-CM

## 2015-02-21 DIAGNOSIS — M25561 Pain in right knee: Secondary | ICD-10-CM | POA: Diagnosis present

## 2015-02-21 DIAGNOSIS — M1711 Unilateral primary osteoarthritis, right knee: Principal | ICD-10-CM | POA: Diagnosis present

## 2015-02-21 DIAGNOSIS — G4730 Sleep apnea, unspecified: Secondary | ICD-10-CM | POA: Diagnosis present

## 2015-02-21 DIAGNOSIS — I10 Essential (primary) hypertension: Secondary | ICD-10-CM | POA: Diagnosis present

## 2015-02-21 DIAGNOSIS — Z6841 Body Mass Index (BMI) 40.0 and over, adult: Secondary | ICD-10-CM | POA: Diagnosis not present

## 2015-02-21 DIAGNOSIS — Z981 Arthrodesis status: Secondary | ICD-10-CM | POA: Diagnosis not present

## 2015-02-21 DIAGNOSIS — E119 Type 2 diabetes mellitus without complications: Secondary | ICD-10-CM | POA: Diagnosis present

## 2015-02-21 DIAGNOSIS — E785 Hyperlipidemia, unspecified: Secondary | ICD-10-CM | POA: Diagnosis present

## 2015-02-21 HISTORY — PX: TOTAL KNEE ARTHROPLASTY: SHX125

## 2015-02-21 LAB — GLUCOSE, CAPILLARY
Glucose-Capillary: 103 mg/dL — ABNORMAL HIGH (ref 65–99)
Glucose-Capillary: 99 mg/dL (ref 65–99)

## 2015-02-21 SURGERY — ARTHROPLASTY, KNEE, TOTAL
Anesthesia: Monitor Anesthesia Care | Site: Knee | Laterality: Right

## 2015-02-21 MED ORDER — ACETAMINOPHEN 325 MG PO TABS: 650.0000 mg | ORAL_TABLET | Freq: Four times a day (QID) | ORAL | Status: DC | PRN

## 2015-02-21 MED ORDER — OXYCODONE-ACETAMINOPHEN 5-325 MG PO TABS: 1.0000 | ORAL_TABLET | ORAL | Status: DC | PRN

## 2015-02-21 MED ORDER — ONDANSETRON HCL 4 MG PO TABS: 4.0000 mg | ORAL_TABLET | Freq: Four times a day (QID) | ORAL | Status: DC | PRN

## 2015-02-21 MED ORDER — FENTANYL CITRATE (PF) 100 MCG/2ML IJ SOLN
INTRAMUSCULAR | Status: DC | PRN
  Administered 2015-02-21 (×5): 50 ug via INTRAVENOUS

## 2015-02-21 MED ORDER — TRANEXAMIC ACID 1000 MG/10ML IV SOLN
1000.0000 mg | INTRAVENOUS | Status: AC
  Administered 2015-02-21: 1000 mg via INTRAVENOUS
  Filled 2015-02-21: qty 10

## 2015-02-21 MED ORDER — LIDOCAINE HCL (CARDIAC) 20 MG/ML IV SOLN
INTRAVENOUS | Status: DC | PRN
  Administered 2015-02-21: 100 mg via INTRAVENOUS

## 2015-02-21 MED ORDER — MAGNESIUM CITRATE PO SOLN: 1.0000 | Freq: Once | ORAL | Status: DC | PRN

## 2015-02-21 MED ORDER — OXYCODONE-ACETAMINOPHEN 5-325 MG PO TABS
ORAL_TABLET | ORAL | Status: AC
  Administered 2015-02-21: 2 via ORAL
  Filled 2015-02-21: qty 2

## 2015-02-21 MED ORDER — ASPIRIN EC 325 MG PO TBEC: 325.0000 mg | DELAYED_RELEASE_TABLET | Freq: Two times a day (BID) | ORAL | Status: AC

## 2015-02-21 MED ORDER — METHOCARBAMOL 750 MG PO TABS: 750.0000 mg | ORAL_TABLET | Freq: Three times a day (TID) | ORAL | Status: DC | PRN

## 2015-02-21 MED ORDER — MIDAZOLAM HCL 2 MG/2ML IJ SOLN
INTRAMUSCULAR | Status: AC
  Filled 2015-02-21: qty 2

## 2015-02-21 MED ORDER — LIDOCAINE HCL (CARDIAC) 20 MG/ML IV SOLN
INTRAVENOUS | Status: AC
  Filled 2015-02-21: qty 5

## 2015-02-21 MED ORDER — HYDROMORPHONE HCL 1 MG/ML IJ SOLN
1.0000 mg | INTRAMUSCULAR | Status: DC | PRN
  Administered 2015-02-21 – 2015-02-22 (×2): 2 mg via INTRAVENOUS
  Filled 2015-02-21 (×2): qty 2

## 2015-02-21 MED ORDER — METHOCARBAMOL 1000 MG/10ML IJ SOLN
500.0000 mg | Freq: Four times a day (QID) | INTRAMUSCULAR | Status: DC | PRN
  Filled 2015-02-21: qty 5

## 2015-02-21 MED ORDER — HYDROMORPHONE HCL 1 MG/ML IJ SOLN
INTRAMUSCULAR | Status: AC
  Filled 2015-02-21: qty 1

## 2015-02-21 MED ORDER — BUPIVACAINE HCL (PF) 0.5 % IJ SOLN
INTRAMUSCULAR | Status: DC | PRN
  Administered 2015-02-21: 20 mL

## 2015-02-21 MED ORDER — METHOCARBAMOL 500 MG PO TABS
500.0000 mg | ORAL_TABLET | Freq: Four times a day (QID) | ORAL | Status: DC | PRN
  Administered 2015-02-21 – 2015-02-23 (×6): 500 mg via ORAL
  Filled 2015-02-21 (×6): qty 1

## 2015-02-21 MED ORDER — FENTANYL CITRATE (PF) 250 MCG/5ML IJ SOLN
INTRAMUSCULAR | Status: AC
  Filled 2015-02-21: qty 5

## 2015-02-21 MED ORDER — ACETAMINOPHEN 650 MG RE SUPP: 650.0000 mg | Freq: Four times a day (QID) | RECTAL | Status: DC | PRN

## 2015-02-21 MED ORDER — METHOCARBAMOL 500 MG PO TABS
ORAL_TABLET | ORAL | Status: AC
  Administered 2015-02-21: 500 mg via ORAL
  Filled 2015-02-21: qty 1

## 2015-02-21 MED ORDER — AMLODIPINE BESYLATE 10 MG PO TABS
10.0000 mg | ORAL_TABLET | Freq: Every day | ORAL | Status: DC
  Administered 2015-02-21 – 2015-02-23 (×3): 10 mg via ORAL
  Filled 2015-02-21 (×3): qty 1

## 2015-02-21 MED ORDER — HYDROMORPHONE HCL 1 MG/ML IJ SOLN
INTRAMUSCULAR | Status: AC
  Administered 2015-02-21: 0.5 mg via INTRAVENOUS
  Filled 2015-02-21: qty 1

## 2015-02-21 MED ORDER — PROPOFOL INFUSION 10 MG/ML OPTIME
INTRAVENOUS | Status: DC | PRN
  Administered 2015-02-21: 50 ug/kg/min via INTRAVENOUS

## 2015-02-21 MED ORDER — HYDROMORPHONE HCL 1 MG/ML IJ SOLN
0.5000 mg | INTRAMUSCULAR | Status: DC | PRN
  Administered 2015-02-21 (×2): 0.5 mg via INTRAVENOUS

## 2015-02-21 MED ORDER — HYDROXYZINE HCL 25 MG PO TABS: 25.0000 mg | ORAL_TABLET | Freq: Every evening | ORAL | Status: DC | PRN

## 2015-02-21 MED ORDER — DEXAMETHASONE SODIUM PHOSPHATE 10 MG/ML IJ SOLN
10.0000 mg | Freq: Two times a day (BID) | INTRAMUSCULAR | Status: AC
  Administered 2015-02-21: 10 mg via INTRAVENOUS
  Filled 2015-02-21 (×3): qty 1

## 2015-02-21 MED ORDER — SODIUM CHLORIDE 0.9 % IV SOLN
INTRAVENOUS | Status: DC
  Administered 2015-02-21: via INTRAVENOUS

## 2015-02-21 MED ORDER — TAMSULOSIN HCL 0.4 MG PO CAPS
0.4000 mg | ORAL_CAPSULE | Freq: Every day | ORAL | Status: DC
  Administered 2015-02-22 – 2015-02-23 (×2): 0.4 mg via ORAL
  Filled 2015-02-21 (×2): qty 1

## 2015-02-21 MED ORDER — ROSUVASTATIN CALCIUM 10 MG PO TABS
10.0000 mg | ORAL_TABLET | Freq: Every day | ORAL | Status: DC
  Administered 2015-02-21 – 2015-02-22 (×2): 10 mg via ORAL
  Filled 2015-02-21 (×2): qty 1

## 2015-02-21 MED ORDER — BISACODYL 5 MG PO TBEC: 5.0000 mg | DELAYED_RELEASE_TABLET | Freq: Every day | ORAL | Status: DC | PRN

## 2015-02-21 MED ORDER — PROMETHAZINE HCL 25 MG/ML IJ SOLN
12.5000 mg | Freq: Four times a day (QID) | INTRAMUSCULAR | Status: DC | PRN
Start: 2015-02-21 — End: 2015-02-23

## 2015-02-21 MED ORDER — MIDAZOLAM HCL 5 MG/5ML IJ SOLN
INTRAMUSCULAR | Status: DC | PRN
  Administered 2015-02-21 (×2): 1 mg via INTRAVENOUS

## 2015-02-21 MED ORDER — ACETAMINOPHEN 325 MG PO TABS: 325.0000 mg | ORAL_TABLET | ORAL | Status: DC | PRN

## 2015-02-21 MED ORDER — DIPHENHYDRAMINE HCL 12.5 MG/5ML PO ELIX: 12.5000 mg | ORAL_SOLUTION | ORAL | Status: DC | PRN

## 2015-02-21 MED ORDER — OXYCODONE-ACETAMINOPHEN 5-325 MG PO TABS
1.0000 | ORAL_TABLET | ORAL | Status: DC | PRN
  Administered 2015-02-21 – 2015-02-23 (×7): 2 via ORAL
  Filled 2015-02-21 (×6): qty 2

## 2015-02-21 MED ORDER — DEXMEDETOMIDINE HCL IN NACL 200 MCG/50ML IV SOLN
INTRAVENOUS | Status: AC
  Filled 2015-02-21: qty 50

## 2015-02-21 MED ORDER — LACTATED RINGERS IV SOLN
INTRAVENOUS | Status: DC | PRN
  Administered 2015-02-21 (×2): via INTRAVENOUS

## 2015-02-21 MED ORDER — 0.9 % SODIUM CHLORIDE (POUR BTL) OPTIME
TOPICAL | Status: DC | PRN
  Administered 2015-02-21: 1000 mL
  Administered 2015-02-21: 3000 mL

## 2015-02-21 MED ORDER — NEBIVOLOL HCL 10 MG PO TABS
20.0000 mg | ORAL_TABLET | Freq: Every day | ORAL | Status: DC
  Administered 2015-02-22 – 2015-02-23 (×2): 20 mg via ORAL
  Filled 2015-02-21 (×2): qty 2

## 2015-02-21 MED ORDER — CHLORHEXIDINE GLUCONATE 4 % EX LIQD: 60.0000 mL | Freq: Once | CUTANEOUS | Status: DC

## 2015-02-21 MED ORDER — POLYETHYLENE GLYCOL 3350 17 G PO PACK: 17.0000 g | PACK | Freq: Every day | ORAL | Status: DC | PRN

## 2015-02-21 MED ORDER — OXYCODONE HCL 5 MG PO TABS: 5.0000 mg | ORAL_TABLET | Freq: Once | ORAL | Status: DC | PRN

## 2015-02-21 MED ORDER — ONDANSETRON HCL 4 MG/2ML IJ SOLN: 4.0000 mg | Freq: Four times a day (QID) | INTRAMUSCULAR | Status: DC | PRN

## 2015-02-21 MED ORDER — HYDROMORPHONE HCL 1 MG/ML IJ SOLN
0.2500 mg | INTRAMUSCULAR | Status: DC | PRN
  Administered 2015-02-21 (×4): 0.5 mg via INTRAVENOUS

## 2015-02-21 MED ORDER — FENTANYL CITRATE (PF) 100 MCG/2ML IJ SOLN
INTRAMUSCULAR | Status: AC
  Filled 2015-02-21: qty 2

## 2015-02-21 MED ORDER — ACETAMINOPHEN 160 MG/5ML PO SOLN
325.0000 mg | ORAL | Status: DC | PRN
  Filled 2015-02-21: qty 20.3

## 2015-02-21 MED ORDER — VANCOMYCIN HCL IN DEXTROSE 1-5 GM/200ML-% IV SOLN
1000.0000 mg | Freq: Two times a day (BID) | INTRAVENOUS | Status: AC
  Administered 2015-02-21: 1000 mg via INTRAVENOUS
  Filled 2015-02-21: qty 200

## 2015-02-21 MED ORDER — ASPIRIN EC 325 MG PO TBEC
325.0000 mg | DELAYED_RELEASE_TABLET | Freq: Two times a day (BID) | ORAL | Status: DC
Start: 2015-02-21 — End: 2015-02-23
  Administered 2015-02-21 – 2015-02-23 (×4): 325 mg via ORAL
  Filled 2015-02-21 (×4): qty 1

## 2015-02-21 MED ORDER — BUPIVACAINE LIPOSOME 1.3 % IJ SUSP
INTRAMUSCULAR | Status: DC | PRN
  Administered 2015-02-21: 20 mL

## 2015-02-21 MED ORDER — BUPIVACAINE IN DEXTROSE 0.75-8.25 % IT SOLN
INTRATHECAL | Status: DC | PRN
  Administered 2015-02-21: 2 mL via INTRATHECAL

## 2015-02-21 MED ORDER — DOCUSATE SODIUM 100 MG PO CAPS
100.0000 mg | ORAL_CAPSULE | Freq: Two times a day (BID) | ORAL | Status: DC
  Administered 2015-02-21 – 2015-02-23 (×4): 100 mg via ORAL
  Filled 2015-02-21 (×4): qty 1

## 2015-02-21 MED ORDER — OLMESARTAN-AMLODIPINE-HCTZ 40-10-25 MG PO TABS: 1.0000 | ORAL_TABLET | Freq: Every day | ORAL | Status: DC

## 2015-02-21 MED ORDER — BUPIVACAINE LIPOSOME 1.3 % IJ SUSP
20.0000 mL | INTRAMUSCULAR | Status: DC
  Filled 2015-02-21: qty 20

## 2015-02-21 MED ORDER — IRBESARTAN 300 MG PO TABS
300.0000 mg | ORAL_TABLET | Freq: Every day | ORAL | Status: DC
  Administered 2015-02-21 – 2015-02-23 (×3): 300 mg via ORAL
  Filled 2015-02-21: qty 2
  Filled 2015-02-21 (×2): qty 1

## 2015-02-21 MED ORDER — PROPOFOL 10 MG/ML IV BOLUS
INTRAVENOUS | Status: AC
  Filled 2015-02-21: qty 20

## 2015-02-21 MED ORDER — ONDANSETRON HCL 4 MG/2ML IJ SOLN
INTRAMUSCULAR | Status: AC
  Filled 2015-02-21: qty 2

## 2015-02-21 MED ORDER — HYDROCHLOROTHIAZIDE 25 MG PO TABS
25.0000 mg | ORAL_TABLET | Freq: Every day | ORAL | Status: DC
  Administered 2015-02-21 – 2015-02-23 (×3): 25 mg via ORAL
  Filled 2015-02-21 (×3): qty 1

## 2015-02-21 MED ORDER — DEXMEDETOMIDINE HCL 200 MCG/2ML IV SOLN
INTRAVENOUS | Status: DC | PRN
  Administered 2015-02-21: 16 ug via INTRAVENOUS
  Administered 2015-02-21 (×3): 8 ug via INTRAVENOUS

## 2015-02-21 MED ORDER — MIDAZOLAM HCL 2 MG/2ML IJ SOLN
INTRAMUSCULAR | Status: AC
  Filled 2015-02-21: qty 4

## 2015-02-21 MED ORDER — FLUTICASONE PROPIONATE 50 MCG/ACT NA SUSP
1.0000 | Freq: Every day | NASAL | Status: DC
  Administered 2015-02-22: 1 via NASAL
  Filled 2015-02-21 (×3): qty 16

## 2015-02-21 MED ORDER — ALUM & MAG HYDROXIDE-SIMETH 200-200-20 MG/5ML PO SUSP
30.0000 mL | ORAL | Status: DC | PRN
  Administered 2015-02-22 – 2015-02-23 (×2): 30 mL via ORAL
  Filled 2015-02-21 (×2): qty 30

## 2015-02-21 MED ORDER — LACTATED RINGERS IV SOLN
INTRAVENOUS | Status: DC
  Administered 2015-02-21: 11:00:00 via INTRAVENOUS

## 2015-02-21 MED ORDER — OXYCODONE HCL 5 MG/5ML PO SOLN: 5.0000 mg | Freq: Once | ORAL | Status: DC | PRN

## 2015-02-21 MED ORDER — ONDANSETRON HCL 4 MG/2ML IJ SOLN
INTRAMUSCULAR | Status: DC | PRN
  Administered 2015-02-21: 4 mg via INTRAVENOUS

## 2015-02-21 SURGICAL SUPPLY — 72 items
BANDAGE ESMARK 6X9 LF (GAUZE/BANDAGES/DRESSINGS) ×1 IMPLANT
BENZOIN TINCTURE PRP APPL 2/3 (GAUZE/BANDAGES/DRESSINGS) ×3 IMPLANT
BLADE SAGITTAL 25.0X1.19X90 (BLADE) ×2 IMPLANT
BLADE SAGITTAL 25.0X1.19X90MM (BLADE) ×1
BLADE SAW SAG 90X13X1.27 (BLADE) ×3 IMPLANT
BNDG ESMARK 6X9 LF (GAUZE/BANDAGES/DRESSINGS) ×3
BOWL SMART MIX CTS (DISPOSABLE) ×3 IMPLANT
CAP KNEE TOTAL 3 SIGMA ×3 IMPLANT
CEMENT HV SMART SET (Cement) ×6 IMPLANT
CLOSURE STERI-STRIP 1/2X4 (GAUZE/BANDAGES/DRESSINGS) ×1
CLOSURE WOUND 1/2 X4 (GAUZE/BANDAGES/DRESSINGS) ×2
CLSR STERI-STRIP ANTIMIC 1/2X4 (GAUZE/BANDAGES/DRESSINGS) ×2 IMPLANT
COVER SURGICAL LIGHT HANDLE (MISCELLANEOUS) ×3 IMPLANT
CUFF TOURNIQUET SINGLE 34IN LL (TOURNIQUET CUFF) ×3 IMPLANT
CUFF TOURNIQUET SINGLE 44IN (TOURNIQUET CUFF) IMPLANT
DRAPE EXTREMITY T 121X128X90 (DRAPE) ×3 IMPLANT
DRAPE IMP U-DRAPE 54X76 (DRAPES) ×3 IMPLANT
DRAPE U-SHAPE 47X51 STRL (DRAPES) ×3 IMPLANT
DRSG AQUACEL AG ADV 3.5X14 (GAUZE/BANDAGES/DRESSINGS) ×3 IMPLANT
DRSG MEPILEX BORDER 4X12 (GAUZE/BANDAGES/DRESSINGS) ×3 IMPLANT
DRSG MEPILEX BORDER 4X8 (GAUZE/BANDAGES/DRESSINGS) IMPLANT
DRSG PAD ABDOMINAL 8X10 ST (GAUZE/BANDAGES/DRESSINGS) ×3 IMPLANT
DURAPREP 26ML APPLICATOR (WOUND CARE) ×3 IMPLANT
ELECT CAUTERY BLADE 6.4 (BLADE) ×3 IMPLANT
ELECT REM PT RETURN 9FT ADLT (ELECTROSURGICAL) ×3
ELECTRODE REM PT RTRN 9FT ADLT (ELECTROSURGICAL) ×1 IMPLANT
EVACUATOR 1/8 PVC DRAIN (DRAIN) ×3 IMPLANT
FACESHIELD STD STERILE (MASK) ×3 IMPLANT
FACESHIELD WRAPAROUND (MASK) ×3 IMPLANT
GAUZE SPONGE 4X4 12PLY STRL (GAUZE/BANDAGES/DRESSINGS) ×3 IMPLANT
GLOVE BIOGEL PI IND STRL 6.5 (GLOVE) ×1 IMPLANT
GLOVE BIOGEL PI IND STRL 8 (GLOVE) ×2 IMPLANT
GLOVE BIOGEL PI INDICATOR 6.5 (GLOVE) ×2
GLOVE BIOGEL PI INDICATOR 8 (GLOVE) ×4
GLOVE ECLIPSE 7.5 STRL STRAW (GLOVE) ×6 IMPLANT
GLOVE SS BIOGEL STRL SZ 6.5 (GLOVE) ×1 IMPLANT
GLOVE SUPERSENSE BIOGEL SZ 6.5 (GLOVE) ×2
GOWN STRL REUS W/ TWL LRG LVL3 (GOWN DISPOSABLE) ×1 IMPLANT
GOWN STRL REUS W/ TWL XL LVL3 (GOWN DISPOSABLE) ×2 IMPLANT
GOWN STRL REUS W/TWL LRG LVL3 (GOWN DISPOSABLE) ×2
GOWN STRL REUS W/TWL XL LVL3 (GOWN DISPOSABLE) ×4
HANDPIECE INTERPULSE COAX TIP (DISPOSABLE) ×2
HOOD PEEL AWAY FACE SHEILD DIS (HOOD) ×9 IMPLANT
IMMOBILIZER KNEE 20 (SOFTGOODS) IMPLANT
IMMOBILIZER KNEE 22 (SOFTGOODS) ×3 IMPLANT
IMMOBILIZER KNEE 22 UNIV (SOFTGOODS) ×3 IMPLANT
KIT BASIN OR (CUSTOM PROCEDURE TRAY) ×3 IMPLANT
KIT ROOM TURNOVER OR (KITS) ×3 IMPLANT
MANIFOLD NEPTUNE II (INSTRUMENTS) ×3 IMPLANT
NEEDLE SPNL 22GX3.5 QUINCKE BK (NEEDLE) ×3 IMPLANT
NS IRRIG 1000ML POUR BTL (IV SOLUTION) ×3 IMPLANT
PACK TOTAL JOINT (CUSTOM PROCEDURE TRAY) ×3 IMPLANT
PACK UNIVERSAL I (CUSTOM PROCEDURE TRAY) ×3 IMPLANT
PAD ARMBOARD 7.5X6 YLW CONV (MISCELLANEOUS) ×6 IMPLANT
PAD CAST 4YDX4 CTTN HI CHSV (CAST SUPPLIES) ×1 IMPLANT
PADDING CAST COTTON 4X4 STRL (CAST SUPPLIES) ×2
PADDING CAST COTTON 6X4 STRL (CAST SUPPLIES) ×3 IMPLANT
SET HNDPC FAN SPRY TIP SCT (DISPOSABLE) ×1 IMPLANT
SPONGE GAUZE 4X4 12PLY STER LF (GAUZE/BANDAGES/DRESSINGS) ×3 IMPLANT
STAPLER VISISTAT 35W (STAPLE) IMPLANT
STRIP CLOSURE SKIN 1/2X4 (GAUZE/BANDAGES/DRESSINGS) ×4 IMPLANT
SUCTION FRAZIER TIP 10 FR DISP (SUCTIONS) ×3 IMPLANT
SUT MNCRL AB 3-0 PS2 18 (SUTURE) ×3 IMPLANT
SUT VIC AB 0 CTB1 27 (SUTURE) ×6 IMPLANT
SUT VIC AB 1 CT1 27 (SUTURE) ×4
SUT VIC AB 1 CT1 27XBRD ANBCTR (SUTURE) ×2 IMPLANT
SUT VIC AB 2-0 CTB1 (SUTURE) ×6 IMPLANT
SYR 50ML LL SCALE MARK (SYRINGE) ×3 IMPLANT
TOWEL OR 17X24 6PK STRL BLUE (TOWEL DISPOSABLE) ×3 IMPLANT
TOWEL OR 17X26 10 PK STRL BLUE (TOWEL DISPOSABLE) ×3 IMPLANT
TRAY FOLEY CATH 16FRSI W/METER (SET/KITS/TRAYS/PACK) IMPLANT
WRAP KNEE MAXI GEL POST OP (GAUZE/BANDAGES/DRESSINGS) ×3 IMPLANT

## 2015-02-21 NOTE — Transfer of Care (Signed)
Immediate Anesthesia Transfer of Care Note  Patient: Terry Wolfe  Procedure(s) Performed: Procedure(s): RIGHT TOTAL KNEE ARTHROPLASTY (Right)  Patient Location: PACU  Anesthesia Type:Spinal and MAC combined with regional for post-op pain  Level of Consciousness: awake, alert , oriented and patient cooperative  Airway & Oxygen Therapy: Patient Spontanous Breathing and Patient connected to nasal cannula oxygen  Post-op Assessment: Report given to RN, Post -op Vital signs reviewed and stable and Patient moving all extremities  Post vital signs: Reviewed and stable  Last Vitals:  Filed Vitals:   02/21/15 1514  BP:   Pulse:   Temp: 36.6 C  Resp:     Complications: No apparent anesthesia complications

## 2015-02-21 NOTE — Anesthesia Postprocedure Evaluation (Signed)
  Anesthesia Post-op Note  Patient: Terry Wolfe  Procedure(s) Performed: Procedure(s): RIGHT TOTAL KNEE ARTHROPLASTY (Right)  Patient Location: PACU  Anesthesia Type: MAC, Spinal   Level of Consciousness: awake, alert  and oriented  Airway and Oxygen Therapy: Patient Spontanous Breathing  Post-op Pain: moderate  Post-op Assessment: Post-op Vital signs reviewed  Post-op Vital Signs: Reviewed  Last Vitals:  Filed Vitals:   02/21/15 2035  BP: 152/75  Pulse: 73  Temp: 37 C  Resp: 16    Complications: No apparent anesthesia complications

## 2015-02-21 NOTE — Progress Notes (Signed)
Pt is having "10/10" pain after 2mg  IV dilaudid. Anesthesia notified. Additional dilaudid given as ordered.

## 2015-02-21 NOTE — Anesthesia Procedure Notes (Signed)
Spinal Patient location during procedure: OR Staffing Anesthesiologist: Kaitrin Seybold, CHRIS Preanesthetic Checklist Completed: patient identified, surgical consent, pre-op evaluation, timeout performed, IV checked, risks and benefits discussed and monitors and equipment checked Spinal Block Patient position: sitting Prep: site prepped and draped and DuraPrep Patient monitoring: heart rate, cardiac monitor, continuous pulse ox and blood pressure Approach: midline Location: L3-4 Injection technique: single-shot Needle Needle type: Pencan  Needle gauge: 24 G Needle length: 10 cm Assessment Sensory level: T6   

## 2015-02-21 NOTE — Brief Op Note (Signed)
02/21/2015  2:48 PM  PATIENT:  Terry Wolfe  54 y.o. male  PRE-OPERATIVE DIAGNOSIS:  degenerative joint disease right knee  POST-OPERATIVE DIAGNOSIS:  degenerative joint disease right knee  PROCEDURE:  Procedure(s): RIGHT TOTAL KNEE ARTHROPLASTY (Right)  SURGEON:  Surgeon(s) and Role:    * Dorna Leitz, MD - Primary  PHYSICIAN ASSISTANT:   ASSISTANTS: bethune   ANESTHESIA:   general  EBL:  Total I/O In: 1000 [I.V.:1000] Out: -   BLOOD ADMINISTERED:none  DRAINS: (1) Hemovact drain(s) in the r knee with  Suction Open   LOCAL MEDICATIONS USED:  OTHER experel  SPECIMEN:  No Specimen  DISPOSITION OF SPECIMEN:  N/A  COUNTS:  YES  TOURNIQUET:   Total Tourniquet Time Documented: Thigh (Right) - 69 minutes Total: Thigh (Right) - 69 minutes   DICTATION: .Other Dictation: Dictation Number [id number]  PLAN OF CARE: Admit to inpatient   PATIENT DISPOSITION:  PACU - hemodynamically stable.   Delay start of Pharmacological VTE agent (>24hrs) due to surgical blood loss or risk of bleeding: no

## 2015-02-21 NOTE — Anesthesia Preprocedure Evaluation (Signed)
Anesthesia Evaluation  Patient identified by MRN, date of birth, ID band Patient awake    Reviewed: Allergy & Precautions, NPO status , Patient's Chart, lab work & pertinent test results  History of Anesthesia Complications (+) PONV and history of anesthetic complications  Airway Mallampati: II  TM Distance: >3 FB Neck ROM: Full    Dental  (+) Teeth Intact, Partial Lower, Partial Upper,    Pulmonary shortness of breath and with exertion, sleep apnea and Continuous Positive Airway Pressure Ventilation ,  breath sounds clear to auscultation        Cardiovascular hypertension, Pt. on medications and Pt. on home beta blockers + angina - Past MI and - CHF Rhythm:Regular     Neuro/Psych negative neurological ROS  negative psych ROS   GI/Hepatic Neg liver ROS, hiatal hernia, GERD-  Medicated and Controlled,  Endo/Other  diabetes, Type 2Morbid obesity  Renal/GU      Musculoskeletal  (+) Arthritis -,   Abdominal   Peds  Hematology   Anesthesia Other Findings Previous trach scar  Reproductive/Obstetrics                             Anesthesia Physical Anesthesia Plan  ASA: III  Anesthesia Plan: MAC and Spinal   Post-op Pain Management:    Induction: Intravenous  Airway Management Planned: Nasal Cannula  Additional Equipment: None  Intra-op Plan:   Post-operative Plan:   Informed Consent: I have reviewed the patients History and Physical, chart, labs and discussed the procedure including the risks, benefits and alternatives for the proposed anesthesia with the patient or authorized representative who has indicated his/her understanding and acceptance.   Dental advisory given  Plan Discussed with: CRNA and Surgeon  Anesthesia Plan Comments:         Anesthesia Quick Evaluation

## 2015-02-21 NOTE — Progress Notes (Signed)
Orthopedic Tech Progress Note Patient Details:  Terry Wolfe May 27, 1961 [id number] Applied CPM to RLE.  Left Bone Foam with pt.'s nurse.       Darrol Poke 02/21/2015, 4:55 PM

## 2015-02-21 NOTE — Discharge Instructions (Signed)

## 2015-02-21 NOTE — H&P (Signed)
TOTAL KNEE ADMISSION H&P  Patient is being admitted for right total knee arthroplasty.  Subjective:  Chief Complaint:right knee pain.  HPI: Terry Wolfe, 54 y.o. male, has a history of pain and functional disability in the right knee due to arthritis and has failed non-surgical conservative treatments for greater than 12 weeks to includeNSAID's and/or analgesics, corticosteriod injections, viscosupplementation injections, weight reduction as appropriate and activity modification.  Onset of symptoms was gradual, starting 5 years ago with gradually worsening course since that time. The patient noted no past surgery on the right knee(s).  Patient currently rates pain in the right knee(s) at 7 out of 10 with activity. Patient has night pain, worsening of pain with activity and weight bearing, pain that interferes with activities of daily living, pain with passive range of motion, crepitus and joint swelling.  Patient has evidence of subchondral cysts, periarticular osteophytes and joint space narrowing by imaging studies. This patient has had failure of all reasonable conservative care. There is no active infection.  Patient Active Problem List   Diagnosis Date Noted  . Spinal stenosis 08/13/2013  . Thrombosed external hemorrhoid-left  01/03/2012  . Felon, left 11/15/2011  . Abscess of thigh 04/04/2011   Past Medical History  Diagnosis Date  . Hypertension   . Hyperlipidemia   . Obesity   . ED (erectile dysfunction)   . Abscess     left thigh  . Epididymitis, right ~ 2009  . History of MRSA infection 03/2011; 11/15/11    left thigh; nasal swab  . Chronic bronchitis     "as a child"  . Pneumonia     "chronic as a child  . GERD (gastroesophageal reflux disease)   . H/O hiatal hernia   . Arthritis     "right knuckles"  . Enlarged prostate with lower urinary tract symptoms (LUTS)     "that's what gave me epididymitis"  . Angina     denied 08/02/13  . Diabetes mellitus without  complication   . Complication of anesthesia   . PONV (postoperative nausea and vomiting)   . Sleep apnea     diagnosed thru sleep study 06/2013, CPAP ordered  . Shortness of breath dyspnea   . Bronchitis   . Constipation   . Diarrhea     Past Surgical History  Procedure Laterality Date  . Umbilical hernia repair  01/26/2004    Ventralex - Dr Rebekah [place]  . Staple hemorrhoidectomy  09/07/2008    [place] Dr Zettie Pho  . Incise and drain abcess  03/2011    left thigh  . Tracheostomy  1972    trach for 4 days as child for accident  . Hernia repair  2005  . Finger fracture surgery  09/28/2001    left middle finger  . Incision and drainage of wound  1990's; 11/15/11    right; left  . Appendectomy  1977  . I&d extremity  11/15/2011    Procedure: IRRIGATION AND DEBRIDEMENT EXTREMITY;  Surgeon: Mcarthur Rossetti, MD;  Location: [place];  Service: Orthopedics;  Laterality: Left;  Irrigation and debridement left thumb  . Pilonidal cyst excision  1980s    removed from lower back  . Anterior fusion cervical spine  04/14/2013    Dr. Lynann Bologna  . Decompressive lumbar laminectomy level 2 N/A 08/13/2013    Procedure: DECOMPRESSIVE LUMBAR LAMINECTOMY LEVEL 2;  Surgeon: Sinclair Ship, MD;  Location: [place];  Service: Orthopedics;  Laterality: N/A;  Lumbar 4-5, lumbar 5-sacrum 1 decompression  .  Colonoscopy      Prescriptions prior to admission  Medication Sig Dispense Refill Last Dose  . aspirin EC 81 MG tablet Take 81 mg by mouth daily.   Past Week at Unknown time  . Dulaglutide (TRULICITY) 1.5 VP/7.1GG SOPN Inject 1.5 mg into the skin once a week. On Saturday   Past Week at Unknown time  . hydrOXYzine (ATARAX/VISTARIL) 25 MG tablet Take 1 tablet (25 mg total) by mouth every 6 (six) hours as needed for itching. (Patient taking differently: Take 25 mg by mouth at bedtime as needed for itching (sleep). ) 28 tablet 0 02/20/2015 at Unknown time  . meloxicam (MOBIC) 15 MG tablet Take 15 mg by mouth daily.   Past  Week at Unknown time  . mometasone (NASONEX) 50 MCG/ACT nasal spray Place 1 spray into both nostrils at bedtime.   Past Week at Unknown time  . Multiple Vitamin (MULITIVITAMIN WITH MINERALS) TABS Take 1 tablet by mouth daily.   Past Week at Unknown time  . nebivolol (BYSTOLIC) 10 MG tablet Take 20 mg by mouth daily.   02/21/2015 at 0915  . Olmesartan-Amlodipine-HCTZ (TRIBENZOR) 40-10-25 MG TABS Take 1 tablet by mouth daily.   02/20/2015 at Unknown time  . oxyCODONE-acetaminophen (PERCOCET/ROXICET) 5-325 MG per tablet Take 1 tablet by mouth daily as needed for severe pain.   Past Month at Unknown time  . rosuvastatin (CRESTOR) 10 MG tablet Take 10 mg by mouth daily.   02/20/2015 at Unknown time  . sildenafil (VIAGRA) 50 MG tablet Take 50 mg by mouth as needed. For erectile dysfunction   Not Taking  . tadalafil (CIALIS) 5 MG tablet Take 5 mg by mouth daily.      . Tamsulosin HCl (FLOMAX) 0.4 MG CAPS Take 0.4 mg by mouth daily after breakfast. For urges to urinate   02/21/2015 at 0915  . Vitamins-Lipotropics (LIPO-FLAVONOID PLUS) TABS Take 1 tablet by mouth at bedtime as needed (ringing in ears).   Past Month at Unknown time  . hydrocortisone 2.5 % cream Apply topically 2 (two) times daily as needed (itching). 15 g 0 More than a month at Unknown time  . ibuprofen (ADVIL,MOTRIN) 200 MG tablet Take 600 mg by mouth daily as needed (pain).   More than a month at Unknown time  . Misc Natural Products (GLUCOSAMINE CHONDROITIN TRIPLE) TABS Take 1 tablet by mouth daily.   More than a month at Unknown time   Allergies  Allergen Reactions  . Etodolac Other (See Comments)    Chest pain    History  Substance Use Topics  . Smoking status: Never Smoker   . Smokeless tobacco: Never Used  . Alcohol Use: Yes     Comment: social    Family History  Problem Relation Age of Onset  . Asthma Other   . Diabetes Other   . Hypertension Other   . Obesity Other   . Sleep apnea Other      ROS ROS: I have reviewed the  patient's review of systems thoroughly and there are no positive responses as relates to the HPI. Objective:  Physical Exam  Vital signs in last 24 hours: Temp:  [98.2 F (36.8 C)] 98.2 F (36.8 C) (08/08 1033) Pulse Rate:  [69-79] 79 (08/08 1106) Resp:  [14-18] 15 (08/08 1106) BP: (150-174)/(78-80) 174/80 mmHg (08/08 1106) SpO2:  [97 %-99 %] 97 % (08/08 1106) Weight:  [323 lb 3.1 oz (146.6 kg)] 323 lb 3.1 oz (146.6 kg) (08/08 1033) Well-developed well-nourished patient  in no acute distress. Alert and oriented x3 HEENT:within normal limits Cardiac: Regular rate and rhythm Pulmonary: Lungs clear to auscultation Abdomen: Soft and nontender.  Normal active bowel sounds  Musculoskeletal: (Right knee: Tender to palpation over the medial joint line.  Pain to range of motion.  No instability.  One plus effusion. Labs: Recent Results (from the past 2160 hour(s))  Glucose, capillary     Status: Abnormal   Collection Time: 02/14/15  3:01 PM  Result Value Ref Range   Glucose-Capillary 110 (H) 65 - 99 mg/dL  Surgical pcr screen     Status: None   Collection Time: 02/14/15  3:40 PM  Result Value Ref Range   MRSA, PCR NEGATIVE NEGATIVE   Staphylococcus aureus NEGATIVE NEGATIVE    Comment:        The Xpert SA Assay (FDA approved for NASAL specimens in patients over 1 years of age), is one component of a comprehensive surveillance program.  Test performance has been validated by [facility] for patients greater than or equal to 44 year old. It is not intended to diagnose infection nor to guide or monitor treatment.   APTT     Status: None   Collection Time: 02/14/15  3:41 PM  Result Value Ref Range   aPTT 28 24 - 37 seconds  CBC WITH DIFFERENTIAL     Status: None   Collection Time: 02/14/15  3:41 PM  Result Value Ref Range   WBC 10.0 4.0 - 10.5 K/uL   RBC 5.07 4.22 - 5.81 MIL/uL   Hemoglobin 14.3 13.0 - 17.0 g/dL   HCT 42.7 39.0 - 52.0 %   MCV 84.2 78.0 - 100.0 fL   MCH  28.2 26.0 - 34.0 pg   MCHC 33.5 30.0 - 36.0 g/dL   RDW 14.0 11.5 - 15.5 %   Platelets 257 150 - 400 K/uL   Neutrophils Relative % 53 43 - 77 %   Neutro Abs 5.4 1.7 - 7.7 K/uL   Lymphocytes Relative 39 12 - 46 %   Lymphs Abs 3.9 0.7 - 4.0 K/uL   Monocytes Relative 5 3 - 12 %   Monocytes Absolute 0.5 0.1 - 1.0 K/uL   Eosinophils Relative 2 0 - 5 %   Eosinophils Absolute 0.2 0.0 - 0.7 K/uL   Basophils Relative 1 0 - 1 %   Basophils Absolute 0.1 0.0 - 0.1 K/uL  Comprehensive metabolic panel     Status: Abnormal   Collection Time: 02/14/15  3:41 PM  Result Value Ref Range   Sodium 139 135 - 145 mmol/L   Potassium 3.0 (L) 3.5 - 5.1 mmol/L   Chloride 102 101 - 111 mmol/L   CO2 27 22 - 32 mmol/L   Glucose, Bld 119 (H) 65 - 99 mg/dL   BUN 9 6 - 20 mg/dL   Creatinine, Ser 0.97 0.61 - 1.24 mg/dL   Calcium 9.1 8.9 - 10.3 mg/dL   Total Protein 7.0 6.5 - 8.1 g/dL   Albumin 4.0 3.5 - 5.0 g/dL   AST 24 15 - 41 U/L   ALT 21 17 - 63 U/L   Alkaline Phosphatase 81 38 - 126 U/L   Total Bilirubin 0.2 (L) 0.3 - 1.2 mg/dL   GFR calc non Af Amer >60 >60 mL/min   GFR calc Af Amer >60 >60 mL/min    Comment: (NOTE) The eGFR has been calculated using the CKD EPI equation. This calculation has not been validated in all clinical situations.  eGFR's persistently <60 mL/min signify possible Chronic Kidney Disease.    Anion gap 10 5 - 15  Protime-INR     Status: None   Collection Time: 02/14/15  3:41 PM  Result Value Ref Range   Prothrombin Time 13.3 11.6 - 15.2 seconds   INR 0.99 0.00 - 1.49  Urinalysis, Routine w reflex microscopic (not at [facility])     Status: None   Collection Time: 02/14/15  3:41 PM  Result Value Ref Range   Color, Urine YELLOW YELLOW   APPearance CLEAR CLEAR   Specific Gravity, Urine 1.010 1.005 - 1.030   pH 7.0 5.0 - 8.0   Glucose, UA NEGATIVE NEGATIVE mg/dL   Hgb urine dipstick NEGATIVE NEGATIVE   Bilirubin Urine NEGATIVE NEGATIVE   Ketones, ur NEGATIVE NEGATIVE mg/dL    Protein, ur NEGATIVE NEGATIVE mg/dL   Urobilinogen, UA 0.2 0.0 - 1.0 mg/dL   Nitrite NEGATIVE NEGATIVE   Leukocytes, UA NEGATIVE NEGATIVE    Comment: MICROSCOPIC NOT DONE ON URINES WITH NEGATIVE PROTEIN, BLOOD, LEUKOCYTES, NITRITE, OR GLUCOSE <1000 mg/dL.  Type and screen     Status: None   Collection Time: 02/14/15  3:56 PM  Result Value Ref Range   ABO/RH(D) O POS    Antibody Screen NEG    Sample Expiration 02/28/2015   Hemoglobin A1c     Status: Abnormal   Collection Time: 02/14/15  3:58 PM  Result Value Ref Range   Hgb A1c MFr Bld 6.4 (H) 4.8 - 5.6 %    Comment: (NOTE)         Pre-diabetes: 5.7 - 6.4         Diabetes: >6.4         Glycemic control for adults with diabetes: <7.0    Mean Plasma Glucose 137 mg/dL    Comment: (NOTE) Performed At: [facility] [place], [place] [id number] Lindon Romp MD SK:[id number]   Glucose, capillary     Status: Abnormal   Collection Time: 02/21/15 10:40 AM  Result Value Ref Range   Glucose-Capillary 103 (H) 65 - 99 mg/dL     Estimated body mass index is 45.1 kg/(m^2) as calculated from the following:   Height as of this encounter: '5\' 11"'  (1.803 m).   Weight as of this encounter: 323 lb 3.1 oz (146.6 kg).   Imaging Review Plain radiographs demonstrate severe degenerative joint disease of the right knee(s). The overall alignment ismild varus. The bone quality appears to be good for age and reported activity level.  Assessment/Plan:  End stage arthritis, right knee   The patient history, physical examination, clinical judgment of the provider and imaging studies are consistent with end stage degenerative joint disease of the right knee(s) and total knee arthroplasty is deemed medically necessary. The treatment options including medical management, injection therapy arthroscopy and arthroplasty were discussed at length. The risks and benefits of total knee arthroplasty were presented and reviewed. The risks due  to aseptic loosening, infection, stiffness, patella tracking problems, thromboembolic complications and other imponderables were discussed. The patient acknowledged the explanation, agreed to proceed with the plan and consent was signed. Patient is being admitted for inpatient treatment for surgery, pain control, PT, OT, prophylactic antibiotics, VTE prophylaxis, progressive ambulation and ADL's and discharge planning. The patient is planning to be discharged home with home health services

## 2015-02-22 ENCOUNTER — Encounter (HOSPITAL_COMMUNITY): Payer: Self-pay | Admitting: Orthopedic Surgery

## 2015-02-22 LAB — BASIC METABOLIC PANEL
ANION GAP: 12 (ref 5–15)
BUN: 11 mg/dL (ref 6–20)
CO2: 24 mmol/L (ref 22–32)
Calcium: 8.6 mg/dL — ABNORMAL LOW (ref 8.9–10.3)
Chloride: 97 mmol/L — ABNORMAL LOW (ref 101–111)
Creatinine, Ser: 1.05 mg/dL (ref 0.61–1.24)
GFR calc Af Amer: >60 mL/min (ref >60)
Glucose, Bld: 221 mg/dL — ABNORMAL HIGH (ref 65–99)
Potassium: 3.4 mmol/L — ABNORMAL LOW (ref 3.5–5.1)
Sodium: 133 mmol/L — ABNORMAL LOW (ref 135–145)

## 2015-02-22 LAB — CBC
HEMATOCRIT: 37.8 % — AB (ref 39.0–52.0)
HEMOGLOBIN: 12.5 g/dL — AB (ref 13.0–17.0)
MCH: 28 pg (ref 26.0–34.0)
MCHC: 33.1 g/dL (ref 30.0–36.0)
MCV: 84.6 fL (ref 78.0–100.0)
Platelets: 306 10*3/uL (ref 150–400)
RBC: 4.47 MIL/uL (ref 4.22–5.81)
RDW: 14 % (ref 11.5–15.5)
WBC: 14.1 10*3/uL — AB (ref 4.0–10.5)

## 2015-02-22 NOTE — Progress Notes (Signed)
Utilization review completed.  

## 2015-02-22 NOTE — Progress Notes (Signed)
Physical Therapy Treatment Patient Details Name: Terry Wolfe MRN: [id number] DOB: May 23, 1961 Today's Date: 02/22/2015    History of Present Illness Patient is a 54 y/o male s/p R TKA. PMH includes HTN, obesity, DM and OSA.    PT Comments    Patient progressing well towards PT goals. Improved ambulation distance. Reviewed exercises and handout this session. Plan for stair training tomorrow prior to d/c home. Will follow acutely to maximize independence and mobility.   Follow Up Recommendations  Home health PT;Supervision/Assistance - 24 hour     Equipment Recommendations  Rolling walker with 5" wheels    Recommendations for Other Services OT consult     Precautions / Restrictions Precautions Precautions: Knee Precaution Booklet Issued: Yes (comment) Precaution Comments: Reviewed no pillow under knee Required Braces or Orthoses: Knee Immobilizer - Right Knee Immobilizer - Right: On when out of bed or walking Restrictions Weight Bearing Restrictions: Yes RLE Weight Bearing: Weight bearing as tolerated    Mobility  Bed Mobility Overal bed mobility: Needs Assistance Bed Mobility: Sit to Supine     Sit to supine: Supervision   General bed mobility comments: HOB flat, no use of rail for support to simulate home.  Transfers Overall transfer level: Needs assistance Equipment used: Rolling walker (2 wheeled) Transfers: Sit to/from Stand Sit to Stand: Min guard         General transfer comment: Min guard for stability/safety.   Ambulation/Gait Ambulation/Gait assistance: Min guard Ambulation Distance (Feet): 130 Feet Assistive device: Rolling walker (2 wheeled) Gait Pattern/deviations: Step-through pattern;Decreased stride length;Trunk flexed   Gait velocity interpretation: <1.8 ft/sec, indicative of risk for recurrent falls General Gait Details: Cues for knee extension during stance phase for quad activation. SA02 93% on RA.   Stairs             Wheelchair Mobility    Modified Rankin (Stroke Patients Only)       Balance Overall balance assessment: Needs assistance Sitting-balance support: Feet supported;No upper extremity supported Sitting balance-Leahy Scale: Good Sitting balance - Comments: Able to reach outside BoS to assist donning pants/underwear.   Standing balance support: During functional activity Standing balance-Leahy Scale: Fair Standing balance comment: Tolerated urination in standing withuot UE support for short period.                    Cognition Arousal/Alertness: Awake/alert Behavior During Therapy: WFL for tasks assessed/performed Overall Cognitive Status: Within Functional Limits for tasks assessed                      Exercises Total Joint Exercises Ankle Circles/Pumps: Both;15 reps;Supine Quad Sets: Both;10 reps;Seated Towel Squeeze: Both;10 reps;Supine Heel Slides: Right;10 reps;Supine Hip ABduction/ADduction: Right;10 reps;Supine Knee Flexion: Right;10 reps;Supine Goniometric ROM: 6-70 degrees knee AROM    General Comments        Pertinent Vitals/Pain Pain Assessment: Faces Faces Pain Scale: Hurts a little bit Pain Location: right knee Pain Descriptors / Indicators: Sore Pain Intervention(s): Monitored during session;Repositioned    Home Living Family/patient expects to be discharged to:: Private residence Living Arrangements: Spouse/significant other Available Help at Discharge: Family;Available 24 hours/day Type of Home: House Home Access: Stairs to enter Entrance Stairs-Rails: None Home Layout: One level Home Equipment: Walker - 4 wheels;Grab bars - toilet;Grab bars - tub/shower      Prior Function Level of Independence: Needs assistance  Gait / Transfers Assistance Needed: Used rollator for longer distances.  ADL's / Homemaking Assistance Needed: Pr required some  assist with dressing - socks/shoes due to inflexibility and pain.     PT Goals (current  goals can now be found in the care plan section) Acute Rehab PT Goals Patient Stated Goal: to be able to walk again PT Goal Formulation: With patient Time For Goal Achievement: 03/08/15 Potential to Achieve Goals: Good Progress towards PT goals: Progressing toward goals    Frequency  7X/week    PT Plan Current plan remains appropriate    Co-evaluation             End of Session Equipment Utilized During Treatment: Gait belt;Right knee immobilizer Activity Tolerance: Patient tolerated treatment well Patient left: in bed;with call bell/phone within reach;with bed alarm set;with SCD's reapplied     Time: 8101-7510 PT Time Calculation (min) (ACUTE ONLY): 23 min  Charges:  $Gait Training: 8-22 mins $Therapeutic Exercise: 8-22 mins                     G Codes:      Oliver Neuwirth A Jaryn Rosko 02/22/2015, 3:02 PM Wray Kearns, [place], DPT [phone]

## 2015-02-22 NOTE — Care Management Note (Signed)
Case Management Note  Patient Details  Name: Terry Wolfe MRN: [id number] Date of Birth: 25-Jan-1961  Subjective/Objective:                S/p right total knee arthroplasty    Action/Plan: Set up with [facility] for HHPT by MD office. Spoke with patient, he requested [facility] for HHPT. Contacted Miranda at [facility] and set up [place]. Contacted Audrey at [facility] and cancelled [place]. T and [place] will be delivering rolling walker and 3N1 to patient's room and CPM to patient's home. Patient stated that his wife will be available to assist him after d/c.    Expected Discharge Date:                  Expected Discharge Plan:  [place]  In-House Referral:  NA  Discharge planning Services  CM Consult  Post Acute Care Choice:  Durable Medical Equipment, Home Health Choice offered to:  Patient  DME Arranged:  3-N-1, CPM, Walker rolling DME Agency:  [employer]  HH Arranged:  PT [place]  Status of Service:     Medicare Important Message Given:    Date Medicare IM Given:    Medicare IM give by:    Date Additional Medicare IM Given:    Additional Medicare Important Message give by:     If discussed at [place] of Stay Meetings, dates discussed:    Additional Comments:  Nila Nephew, RN 02/22/2015, 2:38 PM

## 2015-02-22 NOTE — Progress Notes (Signed)
Subjective: 1 Day Post-Op Procedure(s) (LRB): RIGHT TOTAL KNEE ARTHROPLASTY (Right) Patient reports pain as moderate.  Complains of spasm in his thigh which was relieved with oral muscle relaxers. Taking by mouth okay. Foley out this morning. Not out of bed yet.  Objective: Vital signs in last 24 hours: Temp:  [97.8 F (36.6 C)-99.5 F (37.5 C)] 98.2 F (36.8 C) (08/09 0614) Pulse Rate:  [51-79] 78 (08/09 0614) Resp:  [12-27] 16 (08/09 0614) BP: (114-174)/(57-94) 145/72 mmHg (08/09 0614) SpO2:  [94 %-100 %] 97 % (08/09 0614) Weight:  [146.6 kg (323 lb 3.1 oz)-149.5 kg (329 lb 9.4 oz)] 149.5 kg (329 lb 9.4 oz) (08/08 1823)  Intake/Output from previous day: 08/08 0701 - 08/09 0700 In: 2710 [P.O.:960; I.V.:1750] Out: 3505 [Urine:3050; Drains:405; Blood:50] Intake/Output this shift:     Recent Labs  02/22/15 0814  HGB 12.5*    Recent Labs  02/22/15 0814  WBC 14.1*  RBC 4.47  HCT 37.8*  PLT 306   No results for input(s): NA, K, CL, CO2, BUN, CREATININE, GLUCOSE, CALCIUM in the last 72 hours. No results for input(s): LABPT, INR in the last 72 hours. Right knee exam: Neurovascular intact Sensation intact distally Intact pulses distally Dorsiflexion/Plantar flexion intact Incision: dressing C/D/I Compartment soft  Assessment/Plan: 1 Day Post-Op Procedure(s) (LRB): RIGHT TOTAL KNEE ARTHROPLASTY (Right) Plan: Hemovac drain pulled. Aspirin 325 mg twice daily with SCDs for DVT prophylaxis. Up with therapy Plan for discharge tomorrow  Kadence Mimbs G 02/22/2015, 9:42 AM

## 2015-02-22 NOTE — Progress Notes (Signed)
CPAP was setup with a nasal mask. Patient had brought home nasal mask but was missing connection piece. Auto mode settings minimum setting 6 and max setting 20. No oxygen bled in. Patient tested mask and settings and was comfortable. Patient was not ready to go on at this time and stated he would placed himself on and would call if he needed any further assistance.

## 2015-02-22 NOTE — Progress Notes (Signed)
Orthopedic Tech Progress Note Patient Details:  Terry Wolfe 06-Aug-1960 [id number] On cpm 7:20 pm Patient ID: Terry Wolfe, male   DOB: 1961/04/20, 54 y.o.   MRN: [id number]   [name] 02/22/2015, 7:22 PM

## 2015-02-22 NOTE — Evaluation (Signed)
Physical Therapy Evaluation Patient Details Name: Terry Wolfe MRN: [id number] DOB: 01/01/1961 Today's Date: 02/22/2015   History of Present Illness  Patient is a 54 y/o male s/p R TKA. PMH includes HTN, obesity, DM and OSA.  Clinical Impression  Patient presents with pain and post surgical deficits RLE s/p R TKA. Pt tolerated ambulation and ADLs in sitting/standing with Min A-Min guard assist for safety. Pt will have support from wife at home. Will focus on there ex and gait training in PM session. Will follow acutely to maximize independence and mobility prior to return home.     Follow Up Recommendations Home health PT;Supervision/Assistance - 24 hour    Equipment Recommendations  Rolling walker with 5" wheels (bariatric)    Recommendations for Other Services OT consult     Precautions / Restrictions Precautions Precautions: Knee Precaution Booklet Issued: No Precaution Comments: Reviewed no pillow under knee Required Braces or Orthoses: Knee Immobilizer - Right Knee Immobilizer - Right: On when out of bed or walking Restrictions Weight Bearing Restrictions: Yes RLE Weight Bearing: Weight bearing as tolerated      Mobility  Bed Mobility Overal bed mobility: Needs Assistance Bed Mobility: Supine to Sit     Supine to sit: Supervision;HOB elevated     General bed mobility comments: Use of rail for support.   Transfers Overall transfer level: Needs assistance Equipment used: Rolling walker (2 wheeled) (bariatric) Transfers: Sit to/from Stand Sit to Stand: Min guard         General transfer comment: Min guard for stability/safety.   Ambulation/Gait Ambulation/Gait assistance: Min assist Ambulation Distance (Feet): 100 Feet Assistive device: Rolling walker (2 wheeled) (bariatric) Gait Pattern/deviations: Step-to pattern;Decreased stance time - right;Decreased step length - left;Trunk flexed   Gait velocity interpretation: <1.8 ft/sec, indicative of risk for  recurrent falls General Gait Details: Cues to decrease stride length and for RW proximity/safety. Dyspnea present.   Stairs            Wheelchair Mobility    Modified Rankin (Stroke Patients Only)       Balance Overall balance assessment: Needs assistance Sitting-balance support: Feet supported;No upper extremity supported Sitting balance-Leahy Scale: Good Sitting balance - Comments: Able to reach outside BoS to assist donning pants/underwear.   Standing balance support: During functional activity Standing balance-Leahy Scale: Fair Standing balance comment: Tolerated urination in standing withuot UE support for short period.                             Pertinent Vitals/Pain Pain Assessment: Faces Faces Pain Scale: Hurts even more Pain Location: right knee Pain Descriptors / Indicators: Sore Pain Intervention(s): Limited activity within patient's tolerance;Monitored during session;Premedicated before session;Repositioned    Home Living Family/patient expects to be discharged to:: Private residence Living Arrangements: Spouse/significant other Available Help at Discharge: Family;Available 24 hours/day Type of Home: House Home Access: Stairs to enter Entrance Stairs-Rails: None Entrance Stairs-Number of Steps: 2 Home Layout: One level Home Equipment: Walker - 4 wheels;Grab bars - toilet;Grab bars - tub/shower      Prior Function Level of Independence: Needs assistance   Gait / Transfers Assistance Needed: Used rollator for longer distances.   ADL's / Homemaking Assistance Needed: Pr required some assist with dressing - socks/shoes due to inflexibility and pain.        Hand Dominance        Extremity/Trunk Assessment   Upper Extremity Assessment: Defer to OT evaluation  Lower Extremity Assessment: RLE deficits/detail RLE Deficits / Details: Limited AROM/strength secondary to pain/surgery.       Communication   Communication:  No difficulties  Cognition Arousal/Alertness: Awake/alert Behavior During Therapy: WFL for tasks assessed/performed Overall Cognitive Status: Within Functional Limits for tasks assessed                      General Comments      Exercises Total Joint Exercises Ankle Circles/Pumps: Both;15 reps;Supine Quad Sets: Both;10 reps;Seated      Assessment/Plan    PT Assessment Patient needs continued PT services  PT Diagnosis Difficulty walking;Generalized weakness;Acute pain   PT Problem List Decreased strength;Pain;Decreased range of motion;Impaired sensation;Decreased activity tolerance;Decreased balance;Decreased mobility;Decreased knowledge of use of DME  PT Treatment Interventions Balance training;Gait training;Stair training;Functional mobility training;Therapeutic activities;Therapeutic exercise;Patient/family education;DME instruction   PT Goals (Current goals can be found in the Care Plan section) Acute Rehab PT Goals Patient Stated Goal: to be able to walk again PT Goal Formulation: With patient Time For Goal Achievement: 03/08/15 Potential to Achieve Goals: Good    Frequency 7X/week   Barriers to discharge        Co-evaluation               End of Session Equipment Utilized During Treatment: Gait belt;Right knee immobilizer Activity Tolerance: Patient tolerated treatment well;Patient limited by fatigue Patient left: in chair;with call bell/phone within reach Nurse Communication: Mobility status         Time: 6203-5597 PT Time Calculation (min) (ACUTE ONLY): 39 min   Charges:   PT Evaluation $Initial PT Evaluation Tier I: 1 Procedure PT Treatments $Gait Training: 8-22 mins $Therapeutic Activity: 8-22 mins   PT G Codes:        Abimael Zeiter A Andraya Frigon 02/22/2015, 11:42 AM Wray Kearns, PT, DPT [phone]

## 2015-02-22 NOTE — Op Note (Signed)
NAME:  Terry Wolfe, Terry Wolfe NO.:  [id number]  MEDICAL RECORD NO.:  [id number]  LOCATION:  [id number]                        FACILITY:  [facility]  PHYSICIAN:  Alta Corning, M.D.   DATE OF BIRTH:  02/10/1961  DATE OF PROCEDURE:  02/21/2015 DATE OF DISCHARGE:                              OPERATIVE REPORT   PREOPERATIVE DIAGNOSES: 1. End-stage degenerative joint disease, right knee. 2. Morbid obesity with BMI of 45.1.  POSTOPERATIVE DIAGNOSES: 1. End-stage degenerative joint disease, right knee. 2. Morbid obesity with BMI of 45.1.  PRINCIPAL PROCEDURE:  Right total knee replacement with a Sigma system, size 5 tibia, size 5 femur, 12.5-mm bridging bearing, and a 41-mm all polyethylene patella.  SURGEON:  Alta Corning, M.D.  ASSISTANT:  Gary Fleet, P.A.  ANESTHESIA:  General.  BRIEF HISTORY:  Mr. Visconti is a 54 year old male with long history of significant complaints of right knee pain.  He had been treated conservatively for period of time and after failure of all conservative care, he was taken to the operating room for right total knee replacement.  The patient was having light activity pain and night pain, and had severe bone-on-bone changes on x-ray.  DESCRIPTION OF PROCEDURE:  The patient was taken to the operating room. After adequate level of anesthesia was obtained with general anesthetic, the patient was placed supine on the operating table.  The right leg was prepped and draped in the usual sterile fashion.  Following this, right leg was exsanguinated and blood pressure tourniquet was inflated to 350 mmHg.  Following this, midline incision was made in the subcutaneous tissue down the level of extensor mechanism and a medial parapatellar arthrotomy was undertaken.  Following this, a synovium on the anterior aspect of the femur, retropatellar fat pad, anterior and posterior cruciates, and medial and lateral meniscus were removed.  Following this,  attention was turned to the femur where an intramedullary pilot hole was drilled and a 4-degree distal valgus alignment guide was used and 11-mm of distal bone were taken.  Once this was completed, attention was turned towards the knee where an anterior and posterior cuts were made, chamfers and box to size to a 5 and a 3-degree external rotational block for the alignment.  Once this was done, the anterior and posterior cuts were made, chamfers and box.  Then, attention was turned to the tibia, sized to a 5.  It was drilled and keeled, and significant osteophytes were removed medially.  Following this, trial tibia was put, trial femur was put and 12.5-mm bridging bearing put in.  Excellent and full range of motion and stability were achieved.  At this point, attention was turned to the patella, cut down to a level of 15 mm.  The paddle was placed and lugs were drilled at this point.  Following this, the knee was put through a range of motion.  Excellent stability and range of motion were achieved.  At this point, all trial components were removed.  The knee was then copiously and thoroughly lavaged, pulsatile lavage irrigation and suctioned dry.  The final components were then cemented into place, size 5 tibia, size 5 femur, 12.5-mm bridging bearing  trial was placed and a 41-mm all polyethylene patella was placed and held with a clamp.  Once this was done, all excess bone cement was removed and the cement was allowed to completely harden, and once this was the case, all excess bone cement was removed.  The tourniquet was let down.  All bleeding was controlled with electrocautery.  A 20 mL of Exparel with 20 mL of 0.25% Marcaine were instilled into the knee for postoperative anesthesia and final poly was opened in place at this point.  Medium Hemovac drain was placed and the medial parapatellar arthrotomy was closed with 1 Vicryl running, skin with 0 and 2-0 Vicryl and 3-0 Monocryl  subcuticular.  Benzoin and Steri-Strips were applied. Sterile compressive dressing was applied.  The patient was taken to the recovery room, he was noted to be in satisfactory condition.  Estimated blood loss for the procedure is minimal.     Alta Corning, M.D.     Corliss Skains  D:  02/21/2015  T:  02/22/2015  Job:  [id number]

## 2015-02-23 LAB — CBC
HEMATOCRIT: 33.7 % — AB (ref 39.0–52.0)
Hemoglobin: 11.2 g/dL — ABNORMAL LOW (ref 13.0–17.0)
MCH: 28.2 pg (ref 26.0–34.0)
MCHC: 33.2 g/dL (ref 30.0–36.0)
MCV: 84.9 fL (ref 78.0–100.0)
Platelets: 294 10*3/uL (ref 150–400)
RBC: 3.97 MIL/uL — ABNORMAL LOW (ref 4.22–5.81)
RDW: 14.2 % (ref 11.5–15.5)
WBC: 18.1 10*3/uL — ABNORMAL HIGH (ref 4.0–10.5)

## 2015-02-23 NOTE — Discharge Summary (Signed)
Patient ID: Terry Wolfe MRN: [id number] DOB/AGE: 56-16-1962 56 y.o.  Admit date: 02/21/2015 Discharge date: 02/23/2015  Admission Diagnoses:  Principal Problem:   Primary osteoarthritis of right knee Active Problems:   Sleep apnea   Severe obesity (BMI >= 40)   Discharge Diagnoses:  Same  Past Medical History  Diagnosis Date  . Hypertension   . Hyperlipidemia   . Obesity   . ED (erectile dysfunction)   . Abscess     left thigh  . Epididymitis, right ~ 2009  . History of MRSA infection 03/2011; 11/15/11    left thigh; nasal swab  . Chronic bronchitis     "as a child"  . Pneumonia     "chronic as a child  . GERD (gastroesophageal reflux disease)   . H/O hiatal hernia   . Arthritis     "right knuckles"  . Enlarged prostate with lower urinary tract symptoms (LUTS)     "that's what gave me epididymitis"  . Angina     denied 08/02/13  . Diabetes mellitus without complication   . Complication of anesthesia   . PONV (postoperative nausea and vomiting)   . Sleep apnea     diagnosed thru sleep study 06/2013, CPAP ordered  . Shortness of breath dyspnea   . Bronchitis   . Constipation   . Diarrhea     Surgeries: Procedure(s): RIGHT TOTAL KNEE ARTHROPLASTY on 02/21/2015   Discharged Condition: Improved  Hospital Course: Terry Wolfe is an 54 y.o. male who was admitted 02/21/2015 for operative treatment ofPrimary osteoarthritis of right knee. Patient has severe unremitting pain that affects sleep, daily activities, and work/hobbies. After pre-op clearance the patient was taken to the operating room on 02/21/2015 and underwent  Procedure(s): RIGHT TOTAL KNEE ARTHROPLASTY.    Patient was given perioperative antibiotics: Anti-infectives    Start     Dose/Rate Route Frequency Ordered Stop   02/21/15 2300  vancomycin (VANCOCIN) IVPB 1000 mg/200 mL premix     1,000 mg 200 mL/hr over 60 Minutes Intravenous Every 12 hours 02/21/15 1847 02/22/15 0029   02/21/15 0500  ceFAZolin (ANCEF)  3 g in dextrose 5 % 50 mL IVPB     3 g 160 mL/hr over 30 Minutes Intravenous To [facility] 02/20/15 1421 02/21/15 1305       Patient was given sequential compression devices, early ambulation, and chemoprophylaxis to prevent DVT. The patient made good progress with physical therapy. His Hemovac drain was discontinued on postoperative day 1. At the time of discharge he was eating and voiding without difficulty.  Patient benefited maximally from hospital stay and there were no complications.    Recent vital signs: Patient Vitals for the past 24 hrs:  BP Temp Temp src Pulse Resp SpO2  02/23/15 0602 (!) 151/74 mmHg 98.5 F (36.9 C) Oral 78 18 99 %  02/22/15 2137 (!) 155/125 mmHg 98.7 F (37.1 C) Oral 92 18 97 %  02/22/15 1400 (!) 145/71 mmHg 98.2 F (36.8 C) - 99 18 97 %     Recent laboratory studies:  Recent Labs  02/22/15 0814 02/23/15 0451  WBC 14.1* 18.1*  HGB 12.5* 11.2*  HCT 37.8* 33.7*  PLT 306 294  NA 133*  --   K 3.4*  --   CL 97*  --   CO2 24  --   BUN 11  --   CREATININE 1.05  --   GLUCOSE 221*  --   CALCIUM 8.6*  --  Discharge Medications:     Medication List    STOP taking these medications        ibuprofen 200 MG tablet  Commonly known as:  ADVIL,MOTRIN     meloxicam 15 MG tablet  Commonly known as:  MOBIC      TAKE these medications        aspirin EC 325 MG tablet  Take 1 tablet (325 mg total) by mouth 2 (two) times daily after a meal. Take x 1 month post op to decrease risk of blood clots.     Glucosamine Chondroitin Triple Tabs  Take 1 tablet by mouth daily.     hydrocortisone 2.5 % cream  Apply topically 2 (two) times daily as needed (itching).     hydrOXYzine 25 MG tablet  Commonly known as:  ATARAX/VISTARIL  Take 1 tablet (25 mg total) by mouth every 6 (six) hours as needed for itching.     LIPO-FLAVONOID PLUS Tabs  Take 1 tablet by mouth at bedtime as needed (ringing in ears).     methocarbamol 750 MG tablet   Commonly known as:  ROBAXIN-750  Take 1 tablet (750 mg total) by mouth every 8 (eight) hours as needed for muscle spasms.     mometasone 50 MCG/ACT nasal spray  Commonly known as:  NASONEX  Place 1 spray into both nostrils at bedtime.     multivitamin with minerals Tabs tablet  Take 1 tablet by mouth daily.     nebivolol 10 MG tablet  Commonly known as:  BYSTOLIC  Take 20 mg by mouth daily.     oxyCODONE-acetaminophen 5-325 MG per tablet  Commonly known as:  PERCOCET/ROXICET  Take 1-2 tablets by mouth every 4 (four) hours as needed for severe pain.     rosuvastatin 10 MG tablet  Commonly known as:  CRESTOR  Take 10 mg by mouth daily.     sildenafil 50 MG tablet  Commonly known as:  VIAGRA  Take 50 mg by mouth as needed. For erectile dysfunction     tadalafil 5 MG tablet  Commonly known as:  CIALIS  Take 5 mg by mouth daily.     tamsulosin 0.4 MG Caps capsule  Commonly known as:  FLOMAX  Take 0.4 mg by mouth daily after breakfast. For urges to urinate     TRIBENZOR 40-10-25 MG Tabs  Generic drug:  Olmesartan-Amlodipine-HCTZ  Take 1 tablet by mouth daily.     TRULICITY 1.5 RF/1.6BW Sopn  Generic drug:  Dulaglutide  Inject 1.5 mg into the skin once a week. On Saturday        Diagnostic Studies: Dg Chest 2 View  02/14/2015   CLINICAL DATA:  Preop RIGHT total knee replacement. No chest complaints.  EXAM: CHEST  2 VIEW  COMPARISON:  08/11/2013.  FINDINGS: Normal cardiomediastinal silhouette. Clear lung fields. No bony abnormality. Prior cervical fusion.  IMPRESSION: No active disease.  No change from priors.   Electronically Signed   By: Staci Righter M.D.   On: 02/14/2015 15:51    Disposition: 01-Home or Self Care      Discharge Instructions    CPM    Complete by:  As directed   Continuous passive motion machine (CPM):      Use the CPM from 0 to 60 for 8 hours per day.      You may increase by 5-10 per day.  You may break it up into 2 or 3 sessions per day.  Use CPM for 1-2 weeks or until you are told to stop.     Call MD / Call 911    Complete by:  As directed   If you experience chest pain or shortness of breath, CALL 911 and be transported to the hospital emergency room.  If you develope a fever above 101 F, pus (white drainage) or increased drainage or redness at the wound, or calf pain, call your surgeon's office.     Constipation Prevention    Complete by:  As directed   Drink plenty of fluids.  Prune juice may be helpful.  You may use a stool softener, such as Colace (over the counter) 100 mg twice a day.  Use MiraLax (over the counter) for constipation as needed.     Diet general    Complete by:  As directed      Do not put a pillow under the knee. Place it under the heel.    Complete by:  As directed      Increase activity slowly as tolerated    Complete by:  As directed      Weight bearing as tolerated    Complete by:  As directed   Laterality:  right  Extremity:  Lower     Weight bearing as tolerated    Complete by:  As directed   Laterality:  right  Extremity:  Lower           Follow-up Information    Follow up with GRAVES,Reiss L, MD. Schedule an appointment as soon as possible for a visit in 2 weeks.   Specialty:  Orthopedic Surgery   Contact information:   [place] [address] [phone]       Follow up with [facility].   Why:  They will contact you to schedule home therapy visits.   Contact information:   [address] [place] [address] [phone]        Signed: Erlene Senters 02/23/2015, 8:26 AM

## 2015-02-23 NOTE — Plan of Care (Signed)
Problem: Consults Goal: Diagnosis- Total Joint Replacement Outcome: Completed/Met Date Met:  02/23/15 Primary Total Knee right

## 2015-02-23 NOTE — Progress Notes (Signed)
Patient stated he would place himself on CPAP when ready. RT instructed patient to call if he needed any further assistance.

## 2015-02-23 NOTE — Progress Notes (Signed)
Subjective: 2 Days Post-Op Procedure(s) (LRB): RIGHT TOTAL KNEE ARTHROPLASTY (Right) Patient reports pain as moderate.  Taking by mouth and voiding okay. Making progress with physical therapy.  Objective: Vital signs in last 24 hours: Temp:  [98.2 F (36.8 C)-98.7 F (37.1 C)] 98.5 F (36.9 C) (08/10 0602) Pulse Rate:  [78-99] 78 (08/10 0602) Resp:  [18] 18 (08/10 0602) BP: (145-155)/(71-125) 151/74 mmHg (08/10 0602) SpO2:  [97 %-99 %] 99 % (08/10 0602)  Intake/Output from previous day: 08/09 0701 - 08/10 0700 In: 1200 [P.O.:1200] Out: -  Intake/Output this shift: Total I/O In: 240 [P.O.:240] Out: -    Recent Labs  02/22/15 0814 02/23/15 0451  HGB 12.5* 11.2*    Recent Labs  02/22/15 0814 02/23/15 0451  WBC 14.1* 18.1*  RBC 4.47 3.97*  HCT 37.8* 33.7*  PLT 306 294    Recent Labs  02/22/15 0814  NA 133*  K 3.4*  CL 97*  CO2 24  BUN 11  CREATININE 1.05  GLUCOSE 221*  CALCIUM 8.6*   No results for input(s): LABPT, INR in the last 72 hours.  Right knee exam: : Neurovascular intact Sensation intact distally Intact pulses distally Dorsiflexion/Plantar flexion intact Incision: dressing C/D/I Compartment soft  Assessment/Plan: 2 Days Post-Op Procedure(s) (LRB): RIGHT TOTAL KNEE ARTHROPLASTY (Right) Plan: Continue aspirin 325 mg twice daily with food 1 month postop for DVT prophylaxis. Weight-bear as tolerated on the right. Encouraged aggressive range of motion of the knee. Discharge home with home health Follow-up with Dr. Berenice Primas in the office in 2 weeks.   Alton G 02/23/2015, 8:23 AM

## 2015-02-23 NOTE — Progress Notes (Signed)
Physical Therapy Treatment Patient Details Name: Terry Wolfe MRN: [id number] DOB: [birth year] Today's Date: 02/23/2015    History of Present Illness Patient is a 54 y/o male s/p R TKA. PMH includes HTN, obesity, DM and OSA.    PT Comments    Patient progressing well towards PT goals. Tolerated stair training with Min guard assist for safety. Instructed pt in exercises and reviewed HEP. Pt ambulating supervision level and will have support from wife at home. Pt safe to discharge from a mobility stand point. Will follow acutely if still in hospital.   Follow Up Recommendations  Home health PT;Supervision/Assistance - 24 hour     Equipment Recommendations  Rolling walker with 5" wheels    Recommendations for Other Services       Precautions / Restrictions Precautions Precautions: Knee Precaution Booklet Issued: Yes (comment) Precaution Comments: Reviewed no pillow under knee Required Braces or Orthoses: Knee Immobilizer - Right Knee Immobilizer - Right: On when out of bed or walking Restrictions Weight Bearing Restrictions: Yes RLE Weight Bearing: Weight bearing as tolerated    Mobility  Bed Mobility Overal bed mobility: Needs Assistance Bed Mobility: Supine to Sit;Sit to Supine     Supine to sit: Supervision Sit to supine: Supervision   General bed mobility comments: HOB flat, no use of rail for support to simulate home. Cues for technique and using LLE to bring RLE into bed.   Transfers Overall transfer level: Needs assistance Equipment used: Rolling walker (2 wheeled) Transfers: Sit to/from Stand Sit to Stand: Supervision         General transfer comment: Supervision for safety. Transferred to chair post ambulation.  Ambulation/Gait Ambulation/Gait assistance: Supervision Ambulation Distance (Feet): 200 Feet Assistive device: Rolling walker (2 wheeled) Gait Pattern/deviations: Step-through pattern;Decreased stride length;Trunk flexed   Gait velocity  interpretation: <1.8 ft/sec, indicative of risk for recurrent falls General Gait Details: Cues for knee extension during stance phase for quad activation. Dyspnea present.   Stairs Stairs: Yes Stairs assistance: Min guard Stair Management: Backwards;With walker Number of Stairs: 1 General stair comments: Cues for technique.  Wheelchair Mobility    Modified Rankin (Stroke Patients Only)       Balance Overall balance assessment: Needs assistance Sitting-balance support: Feet supported;No upper extremity supported Sitting balance-Leahy Scale: Good     Standing balance support: During functional activity Standing balance-Leahy Scale: Fair Standing balance comment: Able t pull up pants in standing without UE support.                     Cognition Arousal/Alertness: Awake/alert Behavior During Therapy: WFL for tasks assessed/performed Overall Cognitive Status: Within Functional Limits for tasks assessed                      Exercises Total Joint Exercises Quad Sets: Right;10 reps;Supine Straight Leg Raises: AAROM;Right;10 reps;Supine Long Arc Quad: Right;10 reps;Seated Goniometric ROM: 5-79 degrees knee AROM    General Comments        Pertinent Vitals/Pain Pain Assessment: 0-10 Pain Score: 3  Pain Location: right knee Pain Descriptors / Indicators: Sore Pain Intervention(s): Monitored during session;Repositioned    Home Living                      Prior Function            PT Goals (current goals can now be found in the care plan section) Progress towards PT goals: Progressing toward goals  Frequency  7X/week    PT Plan Current plan remains appropriate    Co-evaluation             End of Session Equipment Utilized During Treatment: Gait belt;Right knee immobilizer Activity Tolerance: Patient tolerated treatment well Patient left: in chair;with call bell/phone within reach     Time: 0913-0939 PT Time Calculation  (min) (ACUTE ONLY): 26 min  Charges:  $Gait Training: 23-37 mins                    G Codes:      Nasim Garofano A Jaydrian Corpening 02/23/2015, 11:12 AM Wray Kearns, [place], DPT [phone]

## 2015-02-28 ENCOUNTER — Other Ambulatory Visit (HOSPITAL_COMMUNITY): Payer: Self-pay | Admitting: Orthopedic Surgery

## 2015-02-28 ENCOUNTER — Ambulatory Visit (HOSPITAL_COMMUNITY)
Admission: RE | Admit: 2015-02-28 | Discharge: 2015-02-28 | Disposition: A | Discharge status: Home / Self Care | Payer: BLUE CROSS/BLUE SHIELD | Source: Ambulatory Visit | Attending: Orthopedic Surgery | Admitting: Orthopedic Surgery

## 2015-02-28 DIAGNOSIS — M7989 Other specified soft tissue disorders: Principal | ICD-10-CM

## 2015-02-28 DIAGNOSIS — M79604 Pain in right leg: Secondary | ICD-10-CM

## 2015-02-28 NOTE — Progress Notes (Signed)
Preliminary results by tech - Right Lower Ext Venous Duplex Completed. Negative for deep and superficial vein thrombosis in the right lower extremity. Dr. Sammuel Hines office was notified. Oda Cogan, BS, RDMS, RVT

## 2015-05-30 ENCOUNTER — Other Ambulatory Visit: Payer: Self-pay | Admitting: Orthopedic Surgery

## 2015-05-30 DIAGNOSIS — M25551 Pain in right hip: Secondary | ICD-10-CM

## 2015-06-17 ENCOUNTER — Ambulatory Visit
Admission: RE | Admit: 2015-06-17 | Discharge: 2015-06-17 | Disposition: A | Discharge status: Home / Self Care | Payer: BLUE CROSS/BLUE SHIELD | Source: Ambulatory Visit | Attending: Orthopedic Surgery | Admitting: Orthopedic Surgery

## 2015-06-17 DIAGNOSIS — M25551 Pain in right hip: Secondary | ICD-10-CM

## 2015-11-30 ENCOUNTER — Ambulatory Visit (INDEPENDENT_AMBULATORY_CARE_PROVIDER_SITE_OTHER): Payer: BLUE CROSS/BLUE SHIELD | Admitting: Cardiovascular Disease

## 2015-11-30 ENCOUNTER — Encounter: Payer: Self-pay | Admitting: Cardiovascular Disease

## 2015-11-30 VITALS — BP 122/80 | HR 64 | Ht 71.0 in | Wt 320.0 lb

## 2015-11-30 DIAGNOSIS — R0789 Other chest pain: Secondary | ICD-10-CM | POA: Diagnosis not present

## 2015-11-30 DIAGNOSIS — G4730 Sleep apnea, unspecified: Secondary | ICD-10-CM

## 2015-11-30 DIAGNOSIS — R079 Chest pain, unspecified: Secondary | ICD-10-CM | POA: Insufficient documentation

## 2015-11-30 NOTE — Progress Notes (Signed)
Cardiology Office Note   Date:  11/30/2015   ID:  FINNLEE KUHNLE, DOB 1961-05-17, MRN [id number]  PCP:  Mayra Neer, MD  Cardiologist:   Mertie Moores, MD   Chief Complaint  Patient presents with  . Chest Pain   Problem list 1. Chest pain 2. Hyperlipidemia 3. Essential hypertension 4. Obstructive sleep apnea 5. DM -   History of Present Illness: Terry Wolfe is a 55 y.o. male who presents for chest pain  He developed pneumonia in January He wears CPAP at night. He has noted some gasping for air in the AM.   Lasts for a few minutes. Has also noted some chest pain with walking  Does not get any exercise. Is retired - used to work for a [employer] .  Does work on Theme park manager on occasion. Has severe arthritis in knees and hands and lower back. Not related to eating or drinking   Has some episodes of dizziness and palpitations .    In January , he had an episode when his BP ws 180/90 Had nausea , diaphoretic.     Has frequent episodes of indigestion .Marland Kitchen  Has tried several antiacids.      Past Medical History  Diagnosis Date  . Hypertension   . Hyperlipidemia   . Obesity   . ED (erectile dysfunction)   . Abscess     left thigh  . Epididymitis, right ~ 2009  . History of MRSA infection 03/2011; 11/15/11    left thigh; nasal swab  . Chronic bronchitis     "as a child"  . Pneumonia     "chronic as a child  . GERD (gastroesophageal reflux disease)   . H/O hiatal hernia   . Arthritis     "right knuckles"  . Enlarged prostate with lower urinary tract symptoms (LUTS)     "that's what gave me epididymitis"  . Angina     denied 08/02/13  . Diabetes mellitus without complication ([place])   . Complication of anesthesia   . PONV (postoperative nausea and vomiting)   . Sleep apnea     diagnosed thru sleep study 06/2013, CPAP ordered  . Shortness of breath dyspnea   . Bronchitis   . Constipation   . Diarrhea     Past Surgical History  Procedure Laterality Date    . Umbilical hernia repair  01/26/2004    Ventralex - Dr Rebekah [place]  . Staple hemorrhoidectomy  09/07/2008    [facility] Dr Zettie Pho  . Incise and drain abcess  03/2011    left thigh  . Tracheostomy  1972    trach for 4 days as child for accident  . Hernia repair  2005  . Finger fracture surgery  09/28/2001    left middle finger  . Incision and drainage of wound  1990's; 11/15/11    right; left  . Appendectomy  1977  . I&d extremity  11/15/2011    Procedure: IRRIGATION AND DEBRIDEMENT EXTREMITY;  Surgeon: Mcarthur Rossetti, MD;  Location: [facility];  Service: Orthopedics;  Laterality: Left;  Irrigation and debridement left thumb  . Pilonidal cyst excision  1980s    removed from lower back  . Anterior fusion cervical spine  04/14/2013    Dr. Lynann Bologna  . Decompressive lumbar laminectomy level 2 N/A 08/13/2013    Procedure: DECOMPRESSIVE LUMBAR LAMINECTOMY LEVEL 2;  Surgeon: Sinclair Ship, MD;  Location: [facility];  Service: Orthopedics;  Laterality: N/A;  Lumbar 4-5, lumbar 5-sacrum 1 decompression  .  Colonoscopy    . Total knee arthroplasty Right 02/21/2015    Procedure: RIGHT TOTAL KNEE ARTHROPLASTY;  Surgeon: Dorna Leitz, MD;  Location: [facility];  Service: Orthopedics;  Laterality: Right;     Current Outpatient Prescriptions  Medication Sig Dispense Refill  . aspirin EC 325 MG tablet Take 1 tablet (325 mg total) by mouth 2 (two) times daily after a meal. Take x 1 month post op to decrease risk of blood clots. 60 tablet 0  . cetirizine (ZYRTEC) 10 MG tablet Take 10 mg by mouth daily.    . cloNIDine (CATAPRES) 0.1 MG tablet Take 0.1 mg by mouth 2 (two) times daily.    . Dulaglutide (TRULICITY) 1.5 0000000 SOPN Inject 1.5 mg into the skin once a week. On Saturday    . Glucos-Chondroit-Collag-Hyal (GLUCOSAMINE CHONDROIT-COLLAGEN) CAPS Take 1,000 mg by mouth daily.    Marland Kitchen HYDROcodone-acetaminophen (NORCO) 10-325 MG tablet Take 1 tablet by mouth daily.    . hydrocortisone 2.5 % cream Apply topically 2 (two)  times daily as needed (itching). 15 g 0  . hydrOXYzine (ATARAX/VISTARIL) 25 MG tablet Take 1 tablet (25 mg total) by mouth every 6 (six) hours as needed for itching. (Patient taking differently: Take 25 mg by mouth at bedtime as needed for itching (sleep). ) 28 tablet 0  . meclizine (ANTIVERT) 25 MG tablet Take 1 tablet by mouth at bedtime.    . methocarbamol (ROBAXIN-750) 750 MG tablet Take 1 tablet (750 mg total) by mouth every 8 (eight) hours as needed for muscle spasms. 60 tablet 0  . Misc Natural Products (GLUCOSAMINE CHONDROITIN TRIPLE) TABS Take 1 tablet by mouth daily.    . mometasone (NASONEX) 50 MCG/ACT nasal spray Place 1 spray into both nostrils at bedtime.    . Multiple Vitamin (MULITIVITAMIN WITH MINERALS) TABS Take 1 tablet by mouth daily.    . nebivolol (BYSTOLIC) 10 MG tablet Take 20 mg by mouth daily.    . Olmesartan-Amlodipine-HCTZ (TRIBENZOR) 40-10-25 MG TABS Take 1 tablet by mouth daily.    Marland Kitchen oxyCODONE-acetaminophen (PERCOCET/ROXICET) 5-325 MG per tablet Take 1-2 tablets by mouth every 4 (four) hours as needed for severe pain. 60 tablet 0  . OXYGEN as directed. cpap    . pantoprazole (PROTONIX) 40 MG tablet Take 40 mg by mouth daily.    . rosuvastatin (CRESTOR) 10 MG tablet Take 10 mg by mouth daily.    . sildenafil (VIAGRA) 50 MG tablet Take 50 mg by mouth as needed. For erectile dysfunction    . tadalafil (CIALIS) 5 MG tablet Take 5 mg by mouth daily.     . Tamsulosin HCl (FLOMAX) 0.4 MG CAPS Take 0.4 mg by mouth daily after breakfast. For urges to urinate    . Vitamins-Lipotropics (LIPO-FLAVONOID PLUS) TABS Take 1 tablet by mouth at bedtime as needed (ringing in ears).    . zolpidem (AMBIEN) 10 MG tablet Take 10 mg by mouth at bedtime.    . diazepam (VALIUM) 5 MG tablet Take 5 mg by mouth as needed. As needed for muscle spasms     No current facility-administered medications for this visit.    Allergies:   Etodolac    Social History:  The patient  reports that he has  never smoked. He has never used smokeless tobacco. He reports that he drinks alcohol. He reports that he does not use illicit drugs.   Family History:  The patient's family history includes Asthma in his other; Diabetes in his other; Hypertension in his  other; Obesity in his other; Sleep apnea in his other.    ROS:  Please see the history of present illness.    Review of Systems: Constitutional:  denies fever, chills, diaphoresis, appetite change and fatigue.  HEENT: denies photophobia, eye pain, redness, hearing loss, ear pain, congestion, sore throat, rhinorrhea, sneezing, neck pain, neck stiffness and tinnitus.  Respiratory: denies SOB, DOE, cough, chest tightness, and wheezing.  Cardiovascular: admits to chest pain,  Gastrointestinal: denies nausea, vomiting, abdominal pain, diarrhea, constipation, blood in stool.  Genitourinary: denies dysuria, urgency, frequency, hematuria, flank pain and difficulty urinating.  Musculoskeletal: denies  myalgias, back pain, joint swelling, arthralgias and gait problem.   Skin: denies pallor, rash and wound.  Neurological: denies dizziness, seizures, syncope, weakness, light-headedness, numbness and headaches.   Hematological: denies adenopathy, easy bruising, personal or family bleeding history.  Psychiatric/ Behavioral: denies suicidal ideation, mood changes, confusion, nervousness, sleep disturbance and agitation.       All other systems are reviewed and negative.    PHYSICAL EXAM: VS:  BP 122/80 mmHg  Pulse 64  Ht 5\' 11"  (1.803 m)  Wt 320 lb (145.151 kg)  BMI 44.65 kg/m2 , BMI Body mass index is 44.65 kg/(m^2). GEN: Well nourished, well developed, in no acute distress HEENT: normal Neck: no JVD, carotid bruits, or masses Cardiac: RRR; no murmurs, rubs, or gallops,no edema  Respiratory:  clear to auscultation bilaterally, normal work of breathing GI: soft, nontender, nondistended, + BS MS: no deformity or atrophy Skin: warm and dry, no  rash Neuro:  Strength and sensation are intact Psych: normal   EKG:  EKG is not ordered today. The ekg ordered 10/31/15  At Dr. Virgilio Belling office  demonstrates NSR at 71,  No ST or T wave changes.    Recent Labs: 02/14/2015: ALT 21 02/22/2015: BUN 11; Creatinine, Ser 1.05; Potassium 3.4*; Sodium 133* 02/23/2015: Hemoglobin 11.2*; Platelets 294    Lipid Panel No results found for: CHOL, TRIG, HDL, CHOLHDL, VLDL, LDLCALC, LDLDIRECT    Wt Readings from Last 3 Encounters:  11/30/15 320 lb (145.151 kg)  02/21/15 329 lb 9.4 oz (149.5 kg)  02/14/15 323 lb 3.1 oz (146.6 kg)      Other studies Reviewed: Additional studies/ records that were reviewed today include: . Review of the above records demonstrates:   ASSESSMENT AND PLAN:  1. Chest pain: Derik presents with episodes of chest discomfort. Some aspects of his chest pain are fairly atypical. He does have several risk factors for coronary artery disease. He has episodes first in the morning also has episodes of chest pain when he exerts himself. He does not do any regular exercise per se.  We'll schedule him for a 2-day  [place] study for further evaluation.  2. Obstructive sleep apnea: He carries a diagnosis of sleep apnea.    Current medicines are reviewed at length with the patient today.  The patient does not have concerns regarding medicines.  The following changes have been made:  no change  Labs/ tests ordered today include:  No orders of the defined types were placed in this encounter.    Disposition:   FU with me as needed .     Mertie Moores, MD  11/30/2015 3:18 PM    [facility] [place], [place], [place]  [address] Phone: [phone]; Fax: [phone]   [facility]  [address] [place],   [address] [phone]   Fax [phone]

## 2015-11-30 NOTE — Patient Instructions (Signed)
Medication Instructions:  Your physician recommends that you continue on your current medications as directed. Please refer to the Current Medication list given to you today.   Labwork: None Ordered   Testing/Procedures: Your physician has requested that you have a lexiscan myoview. For further information please visit [email or web address]. Please follow instruction sheet, as given.   Follow-Up: Your physician recommends that you schedule a follow-up appointment in: as needed with Dr. Nahser.    If you need a refill on your cardiac medications before your next appointment, please call your pharmacy.   Thank you for choosing [facility]! Zamariya Neal, RN [phone]   

## 2015-12-07 ENCOUNTER — Telehealth (HOSPITAL_COMMUNITY): Payer: Self-pay | Admitting: *Deleted

## 2015-12-07 NOTE — Telephone Encounter (Signed)
Patient given detailed instructions per Myocardial Perfusion Study Information Sheet for the test on 12/13/15. Patient notified to arrive 15 minutes early and that it is imperative to arrive on time for appointment to keep from having the test rescheduled.  If you need to cancel or reschedule your appointment, please call the office within 24 hours of your appointment. Failure to do so may result in a cancellation of your appointment, and a $50 no show fee. Patient verbalized understanding. Jovonte Commins J Ashly Goethe, RN  

## 2015-12-13 ENCOUNTER — Ambulatory Visit (HOSPITAL_COMMUNITY): Payer: BLUE CROSS/BLUE SHIELD | Attending: Cardiovascular Disease

## 2015-12-13 DIAGNOSIS — R0789 Other chest pain: Secondary | ICD-10-CM | POA: Diagnosis present

## 2015-12-13 DIAGNOSIS — R42 Dizziness and giddiness: Secondary | ICD-10-CM | POA: Diagnosis not present

## 2015-12-13 DIAGNOSIS — R002 Palpitations: Secondary | ICD-10-CM | POA: Diagnosis not present

## 2015-12-13 MED ORDER — REGADENOSON 0.4 MG/5ML IV SOLN
0.4000 mg | Freq: Once | INTRAVENOUS | Status: AC
  Administered 2015-12-13: 0.4 mg via INTRAVENOUS

## 2015-12-13 MED ORDER — TECHNETIUM TC 99M TETROFOSMIN IV KIT
32.2000 | PACK | Freq: Once | INTRAVENOUS | Status: AC | PRN
  Administered 2015-12-13: 32.2 via INTRAVENOUS
  Filled 2015-12-13: qty 32

## 2015-12-14 ENCOUNTER — Ambulatory Visit (HOSPITAL_COMMUNITY): Payer: BLUE CROSS/BLUE SHIELD | Attending: Cardiovascular Disease

## 2015-12-14 LAB — MYOCARDIAL PERFUSION IMAGING
CHL CUP NUCLEAR SRS: 4
CHL CUP NUCLEAR SSS: 4
CHL CUP RESTING HR STRESS: 57 {beats}/min
LV dias vol: 150 mL (ref 62–150)
LV sys vol: 73 mL
Peak HR: 78 {beats}/min
RATE: 0.2
SDS: 0
TID: 1.04

## 2015-12-14 MED ORDER — TECHNETIUM TC 99M TETROFOSMIN IV KIT
31.5000 | PACK | Freq: Once | INTRAVENOUS | Status: AC | PRN
  Administered 2015-12-14: 32 via INTRAVENOUS
  Filled 2015-12-14: qty 32

## 2016-01-18 ENCOUNTER — Encounter: Payer: Self-pay | Admitting: Cardiovascular Disease

## 2016-04-05 DIAGNOSIS — G4733 Obstructive sleep apnea (adult) (pediatric): Secondary | ICD-10-CM | POA: Diagnosis not present

## 2016-05-13 ENCOUNTER — Ambulatory Visit (HOSPITAL_COMMUNITY)
Admission: EM | Admit: 2016-05-13 | Discharge: 2016-05-13 | Disposition: A | Discharge status: Home / Self Care | Payer: PPO | Attending: Physician Assistant | Admitting: Physician Assistant

## 2016-05-13 ENCOUNTER — Encounter (HOSPITAL_COMMUNITY): Payer: Self-pay | Admitting: Emergency Medicine

## 2016-05-13 DIAGNOSIS — D369 Benign neoplasm, unspecified site: Secondary | ICD-10-CM

## 2016-05-13 MED ORDER — CLINDAMYCIN HCL 300 MG PO CAPS: 300.0000 mg | ORAL_CAPSULE | Freq: Four times a day (QID) | ORAL | 0 refills | Status: DC

## 2016-05-13 NOTE — Discharge Instructions (Signed)
Apply warm compress to neck  And soak foot in warm soapy water.

## 2016-05-13 NOTE — ED Provider Notes (Signed)
CSN: [id number]     Arrival date & time 05/13/16  1202 History   None    Chief Complaint  Patient presents with  . Abscess   (Consider location/radiation/quality/duration/timing/severity/associated sxs/prior Treatment) HPI NP 55 Y/O MALE WITH LUMP ON BACK OF NECK THAT GETS BIG AND THEN GOES AWAY SEVERAL TIMES OVER THE LAST 4-5 YEARS. AT THIS TIME THE LUMP IS MORE PAINFUL. ALSO DROPPED A DRILL ONTO HIS FOOT 1 WEEK AGO. HX OF DIABETES.  Past Medical History:  Diagnosis Date  . Abscess    left thigh  . Angina    denied 08/02/13  . Arthritis    "right knuckles"  . Bronchitis   . Chronic bronchitis    "as a child"  . Complication of anesthesia   . Constipation   . Diabetes mellitus without complication ([place])   . Diarrhea   . ED (erectile dysfunction)   . Enlarged prostate with lower urinary tract symptoms (LUTS)    "that's what gave me epididymitis"  . Epididymitis, right ~ 2009  . GERD (gastroesophageal reflux disease)   . H/O hiatal hernia   . History of MRSA infection 03/2011; 11/15/11   left thigh; nasal swab  . Hyperlipidemia   . Hypertension   . Obesity   . Pneumonia    "chronic as a child  . PONV (postoperative nausea and vomiting)   . Shortness of breath dyspnea   . Sleep apnea    diagnosed thru sleep study 06/2013, CPAP ordered   Past Surgical History:  Procedure Laterality Date  . ANTERIOR FUSION CERVICAL SPINE  04/14/2013   Dr. Lynann Bologna  . APPENDECTOMY  1977  . COLONOSCOPY    . DECOMPRESSIVE LUMBAR LAMINECTOMY LEVEL 2 N/A 08/13/2013   Procedure: DECOMPRESSIVE LUMBAR LAMINECTOMY LEVEL 2;  Surgeon: Sinclair Ship, MD;  Location: [facility];  Service: Orthopedics;  Laterality: N/A;  Lumbar 4-5, lumbar 5-sacrum 1 decompression  . FINGER FRACTURE SURGERY  09/28/2001   left middle finger  . HERNIA REPAIR  2005  . I&D EXTREMITY  11/15/2011   Procedure: IRRIGATION AND DEBRIDEMENT EXTREMITY;  Surgeon: Mcarthur Rossetti, MD;  Location: [facility];  Service: Orthopedics;   Laterality: Left;  Irrigation and debridement left thumb  . INCISE AND DRAIN ABCESS  03/2011   left thigh  . INCISION AND DRAINAGE OF WOUND  1990's; 11/15/11   right; left  . PILONIDAL CYST EXCISION  1980s   removed from lower back  . STAPLE HEMORRHOIDECTOMY  09/07/2008   [facility] Zettie Pho  . TOTAL KNEE ARTHROPLASTY Right 02/21/2015   Procedure: RIGHT TOTAL KNEE ARTHROPLASTY;  Surgeon: Dorna Leitz, MD;  Location: [facility];  Service: Orthopedics;  Laterality: Right;  . TRACHEOSTOMY  1972   trach for 4 days as child for accident  . UMBILICAL HERNIA REPAIR  01/26/2004   Ventralex - Dr Rebekah Chesterfield   Family History  Problem Relation Age of Onset  . Asthma Other   . Diabetes Other   . Hypertension Other   . Obesity Other   . Sleep apnea Other    Social History  Substance Use Topics  . Smoking status: Never Smoker  . Smokeless tobacco: Never Used  . Alcohol use Yes     Comment: social    Review of Systems  Denies: HEADACHE, NAUSEA, ABDOMINAL PAIN, CHEST PAIN, CONGESTION, DYSURIA, SHORTNESS OF BREATH  Allergies  Etodolac  Home Medications   Prior to Admission medications   Medication Sig Start Date End Date Taking? Authorizing Provider  aspirin  EC 325 MG tablet Take 1 tablet (325 mg total) by mouth 2 (two) times daily after a meal. Take x 1 month post op to decrease risk of blood clots. 02/21/15   Gary Fleet, PA-C  cetirizine (ZYRTEC) 10 MG tablet Take 10 mg by mouth daily.    Historical Provider, MD  clindamycin (CLEOCIN) 300 MG capsule Take 1 capsule (300 mg total) by mouth every 6 (six) hours. 05/13/16   Konrad Felix, PA  cloNIDine (CATAPRES) 0.1 MG tablet Take 0.1 mg by mouth 2 (two) times daily. 07/06/15   Historical Provider, MD  diazepam (VALIUM) 5 MG tablet Take 5 mg by mouth as needed. As needed for muscle spasms    Historical Provider, MD  Dulaglutide (TRULICITY) 1.5 0000000 SOPN Inject 1.5 mg into the skin once a week. On Saturday    Historical Provider, MD   Glucos-Chondroit-Collag-Hyal (GLUCOSAMINE CHONDROIT-COLLAGEN) CAPS Take 1,000 mg by mouth daily.    Historical Provider, MD  HYDROcodone-acetaminophen (NORCO) 10-325 MG tablet Take 1 tablet by mouth daily.    Historical Provider, MD  hydrocortisone 2.5 % cream Apply topically 2 (two) times daily as needed (itching). 07/01/14   Mercedes Camprubi-Soms, PA-C  hydrOXYzine (ATARAX/VISTARIL) 25 MG tablet Take 1 tablet (25 mg total) by mouth every 6 (six) hours as needed for itching. Patient taking differently: Take 25 mg by mouth at bedtime as needed for itching (sleep).  07/01/14   Mercedes Camprubi-Soms, PA-C  meclizine (ANTIVERT) 25 MG tablet Take 1 tablet by mouth at bedtime. 10/31/15   Historical Provider, MD  methocarbamol (ROBAXIN-750) 750 MG tablet Take 1 tablet (750 mg total) by mouth every 8 (eight) hours as needed for muscle spasms. 02/21/15   Gary Fleet, PA-C  Misc Natural Products (GLUCOSAMINE CHONDROITIN TRIPLE) TABS Take 1 tablet by mouth daily.    Historical Provider, MD  mometasone (NASONEX) 50 MCG/ACT nasal spray Place 1 spray into both nostrils at bedtime.    Historical Provider, MD  Multiple Vitamin (MULITIVITAMIN WITH MINERALS) TABS Take 1 tablet by mouth daily.    Historical Provider, MD  nebivolol (BYSTOLIC) 10 MG tablet Take 20 mg by mouth daily.    Historical Provider, MD  Olmesartan-Amlodipine-HCTZ (TRIBENZOR) 40-10-25 MG TABS Take 1 tablet by mouth daily.    Historical Provider, MD  oxyCODONE-acetaminophen (PERCOCET/ROXICET) 5-325 MG per tablet Take 1-2 tablets by mouth every 4 (four) hours as needed for severe pain. 02/21/15   Gary Fleet, PA-C  OXYGEN as directed. cpap 12/09/13   Historical Provider, MD  pantoprazole (PROTONIX) 40 MG tablet Take 40 mg by mouth daily. 10/31/15   Historical Provider, MD  rosuvastatin (CRESTOR) 10 MG tablet Take 10 mg by mouth daily.    Historical Provider, MD  sildenafil (VIAGRA) 50 MG tablet Take 50 mg by mouth as needed. For erectile  dysfunction    Historical Provider, MD  tadalafil (CIALIS) 5 MG tablet Take 5 mg by mouth daily.     Historical Provider, MD  Tamsulosin HCl (FLOMAX) 0.4 MG CAPS Take 0.4 mg by mouth daily after breakfast. For urges to urinate    Historical Provider, MD  Vitamins-Lipotropics (LIPO-FLAVONOID PLUS) TABS Take 1 tablet by mouth at bedtime as needed (ringing in ears).    Historical Provider, MD  zolpidem (AMBIEN) 10 MG tablet Take 10 mg by mouth at bedtime.    Historical Provider, MD   Meds Ordered and Administered this Visit  Medications - No data to display  BP 153/82 (BP Location: Left Arm)   Pulse  60   Temp 98.2 F (36.8 C) (Oral)   Resp 20   SpO2 98%  No data found.   Physical Exam NURSES NOTES AND VITAL SIGNS REVIEWED. CONSTITUTIONAL: Well developed, well nourished, no acute distress HEENT: normocephalic, atraumatic EYES: Conjunctiva normal NECK:normal ROM, supple, no adenopathy, FIRM CYST STRUCTURE NOTED BACK OF NECK RIGHT SIDE. TENDER TO PALPATION. NOT FLUCTUANT.  PULMONARY:No respiratory distress, normal effort ABDOMINAL: Soft, ND, NT BS+, No CVAT MUSCULOSKELETAL: Normal ROM of all extremities, RIGHT FOOT, JUST BELOW GREAT TOE IS A HEALING NONINFECTED PUNCTURE WOUND. NOT TENDER, NOT RED.  SKIN: warm and dry without rash PSYCHIATRIC: Mood and affect, behavior are normal  Urgent Care Course   Clinical Course    Procedures (including critical care time)  Labs Review Labs Reviewed - No data to display  Imaging Review No results found.   Visual Acuity Review  Right Eye Distance:   Left Eye Distance:   Bilateral Distance:    Right Eye Near:   Left Eye Near:    Bilateral Near:         MDM   1. Dermoid cyst     Patient is reassured that there are no issues that require transfer to higher level of care at this time or additional tests. Patient is advised to continue home symptomatic treatment. Patient is advised that if there are new or worsening symptoms to  attend the emergency department, contact primary care provider, or return to UC. Instructions of care provided discharged home in stable condition.    THIS NOTE WAS GENERATED USING A VOICE RECOGNITION SOFTWARE PROGRAM. ALL REASONABLE EFFORTS  WERE MADE TO PROOFREAD THIS DOCUMENT FOR ACCURACY.  I have verbally reviewed the discharge instructions with the patient. A printed AVS was given to the patient.  All questions were answered prior to discharge.      Konrad Felix, PA 05/13/16 1328

## 2016-05-13 NOTE — ED Triage Notes (Addendum)
The patient presented to the [facility] with a complaint of a possible abscess on the back of his neck. The patient reported that he noticed it two days ago but it has gotten more painful today.

## 2016-05-15 DIAGNOSIS — L723 Sebaceous cyst: Secondary | ICD-10-CM | POA: Diagnosis not present

## 2016-05-15 DIAGNOSIS — L089 Local infection of the skin and subcutaneous tissue, unspecified: Secondary | ICD-10-CM | POA: Diagnosis not present

## 2016-05-28 DIAGNOSIS — R197 Diarrhea, unspecified: Secondary | ICD-10-CM | POA: Diagnosis not present

## 2016-05-28 DIAGNOSIS — L089 Local infection of the skin and subcutaneous tissue, unspecified: Secondary | ICD-10-CM | POA: Diagnosis not present

## 2016-05-28 DIAGNOSIS — L723 Sebaceous cyst: Secondary | ICD-10-CM | POA: Diagnosis not present

## 2016-05-29 DIAGNOSIS — R197 Diarrhea, unspecified: Secondary | ICD-10-CM | POA: Diagnosis not present

## 2016-06-26 DIAGNOSIS — E119 Type 2 diabetes mellitus without complications: Secondary | ICD-10-CM | POA: Diagnosis not present

## 2016-06-26 DIAGNOSIS — H1820 Unspecified corneal edema: Secondary | ICD-10-CM | POA: Diagnosis not present

## 2016-06-26 DIAGNOSIS — Z7984 Long term (current) use of oral hypoglycemic drugs: Secondary | ICD-10-CM | POA: Diagnosis not present

## 2016-06-26 DIAGNOSIS — H18413 Arcus senilis, bilateral: Secondary | ICD-10-CM | POA: Diagnosis not present

## 2016-06-26 DIAGNOSIS — H11423 Conjunctival edema, bilateral: Secondary | ICD-10-CM | POA: Diagnosis not present

## 2016-06-26 DIAGNOSIS — H04123 Dry eye syndrome of bilateral lacrimal glands: Secondary | ICD-10-CM | POA: Diagnosis not present

## 2016-06-26 DIAGNOSIS — H2513 Age-related nuclear cataract, bilateral: Secondary | ICD-10-CM | POA: Diagnosis not present

## 2016-07-11 DIAGNOSIS — G4733 Obstructive sleep apnea (adult) (pediatric): Secondary | ICD-10-CM | POA: Diagnosis not present

## 2016-07-18 DIAGNOSIS — E7800 Pure hypercholesterolemia, unspecified: Secondary | ICD-10-CM | POA: Diagnosis not present

## 2016-07-18 DIAGNOSIS — M4800 Spinal stenosis, site unspecified: Secondary | ICD-10-CM | POA: Diagnosis not present

## 2016-07-18 DIAGNOSIS — I10 Essential (primary) hypertension: Secondary | ICD-10-CM | POA: Diagnosis not present

## 2016-07-18 DIAGNOSIS — E119 Type 2 diabetes mellitus without complications: Secondary | ICD-10-CM | POA: Diagnosis not present

## 2016-08-15 DIAGNOSIS — H40003 Preglaucoma, unspecified, bilateral: Secondary | ICD-10-CM | POA: Diagnosis not present

## 2016-08-15 DIAGNOSIS — H1045 Other chronic allergic conjunctivitis: Secondary | ICD-10-CM | POA: Diagnosis not present

## 2016-08-15 DIAGNOSIS — H2513 Age-related nuclear cataract, bilateral: Secondary | ICD-10-CM | POA: Diagnosis not present

## 2016-08-15 DIAGNOSIS — H52223 Regular astigmatism, bilateral: Secondary | ICD-10-CM | POA: Diagnosis not present

## 2016-08-15 DIAGNOSIS — H524 Presbyopia: Secondary | ICD-10-CM | POA: Diagnosis not present

## 2016-08-15 DIAGNOSIS — H11423 Conjunctival edema, bilateral: Secondary | ICD-10-CM | POA: Diagnosis not present

## 2016-08-15 DIAGNOSIS — H18413 Arcus senilis, bilateral: Secondary | ICD-10-CM | POA: Diagnosis not present

## 2016-08-15 DIAGNOSIS — H35033 Hypertensive retinopathy, bilateral: Secondary | ICD-10-CM | POA: Diagnosis not present

## 2016-08-15 DIAGNOSIS — H25013 Cortical age-related cataract, bilateral: Secondary | ICD-10-CM | POA: Diagnosis not present

## 2016-08-15 DIAGNOSIS — H5213 Myopia, bilateral: Secondary | ICD-10-CM | POA: Diagnosis not present

## 2016-08-15 DIAGNOSIS — H40023 Open angle with borderline findings, high risk, bilateral: Secondary | ICD-10-CM | POA: Diagnosis not present

## 2016-08-15 DIAGNOSIS — E119 Type 2 diabetes mellitus without complications: Secondary | ICD-10-CM | POA: Diagnosis not present

## 2016-09-10 DIAGNOSIS — N401 Enlarged prostate with lower urinary tract symptoms: Secondary | ICD-10-CM | POA: Diagnosis not present

## 2016-09-10 DIAGNOSIS — E291 Testicular hypofunction: Secondary | ICD-10-CM | POA: Diagnosis not present

## 2016-09-10 DIAGNOSIS — R350 Frequency of micturition: Secondary | ICD-10-CM | POA: Diagnosis not present

## 2016-09-10 DIAGNOSIS — N486 Induration penis plastica: Secondary | ICD-10-CM | POA: Diagnosis not present

## 2016-09-10 DIAGNOSIS — N5201 Erectile dysfunction due to arterial insufficiency: Secondary | ICD-10-CM | POA: Diagnosis not present

## 2016-09-17 DIAGNOSIS — H17821 Peripheral opacity of cornea, right eye: Secondary | ICD-10-CM | POA: Diagnosis not present

## 2016-09-17 DIAGNOSIS — H18452 Nodular corneal degeneration, left eye: Secondary | ICD-10-CM | POA: Diagnosis not present

## 2016-09-17 DIAGNOSIS — H40013 Open angle with borderline findings, low risk, bilateral: Secondary | ICD-10-CM | POA: Diagnosis not present

## 2016-09-17 DIAGNOSIS — H2513 Age-related nuclear cataract, bilateral: Secondary | ICD-10-CM | POA: Diagnosis not present

## 2016-10-12 DIAGNOSIS — G4733 Obstructive sleep apnea (adult) (pediatric): Secondary | ICD-10-CM | POA: Diagnosis not present

## 2017-01-28 DIAGNOSIS — G4733 Obstructive sleep apnea (adult) (pediatric): Secondary | ICD-10-CM | POA: Diagnosis not present

## 2017-01-30 DIAGNOSIS — N400 Enlarged prostate without lower urinary tract symptoms: Secondary | ICD-10-CM | POA: Diagnosis not present

## 2017-01-30 DIAGNOSIS — I10 Essential (primary) hypertension: Secondary | ICD-10-CM | POA: Diagnosis not present

## 2017-01-30 DIAGNOSIS — K219 Gastro-esophageal reflux disease without esophagitis: Secondary | ICD-10-CM | POA: Diagnosis not present

## 2017-01-30 DIAGNOSIS — K580 Irritable bowel syndrome with diarrhea: Secondary | ICD-10-CM | POA: Diagnosis not present

## 2017-01-30 DIAGNOSIS — E1165 Type 2 diabetes mellitus with hyperglycemia: Secondary | ICD-10-CM | POA: Diagnosis not present

## 2017-01-30 DIAGNOSIS — M4800 Spinal stenosis, site unspecified: Secondary | ICD-10-CM | POA: Diagnosis not present

## 2017-01-30 DIAGNOSIS — G4733 Obstructive sleep apnea (adult) (pediatric): Secondary | ICD-10-CM | POA: Diagnosis not present

## 2017-01-30 DIAGNOSIS — Z125 Encounter for screening for malignant neoplasm of prostate: Secondary | ICD-10-CM | POA: Diagnosis not present

## 2017-01-30 DIAGNOSIS — Z0000 Encounter for general adult medical examination without abnormal findings: Secondary | ICD-10-CM | POA: Diagnosis not present

## 2017-01-30 DIAGNOSIS — E7800 Pure hypercholesterolemia, unspecified: Secondary | ICD-10-CM | POA: Diagnosis not present

## 2017-03-11 DIAGNOSIS — Z125 Encounter for screening for malignant neoplasm of prostate: Secondary | ICD-10-CM | POA: Diagnosis not present

## 2017-03-11 DIAGNOSIS — R350 Frequency of micturition: Secondary | ICD-10-CM | POA: Diagnosis not present

## 2017-03-11 DIAGNOSIS — N401 Enlarged prostate with lower urinary tract symptoms: Secondary | ICD-10-CM | POA: Diagnosis not present

## 2017-03-11 DIAGNOSIS — N5201 Erectile dysfunction due to arterial insufficiency: Secondary | ICD-10-CM | POA: Diagnosis not present

## 2017-03-11 DIAGNOSIS — N486 Induration penis plastica: Secondary | ICD-10-CM | POA: Diagnosis not present

## 2017-03-28 DIAGNOSIS — Z7984 Long term (current) use of oral hypoglycemic drugs: Secondary | ICD-10-CM | POA: Diagnosis not present

## 2017-03-28 DIAGNOSIS — H10413 Chronic giant papillary conjunctivitis, bilateral: Secondary | ICD-10-CM | POA: Diagnosis not present

## 2017-03-28 DIAGNOSIS — Z794 Long term (current) use of insulin: Secondary | ICD-10-CM | POA: Diagnosis not present

## 2017-03-28 DIAGNOSIS — H35033 Hypertensive retinopathy, bilateral: Secondary | ICD-10-CM | POA: Diagnosis not present

## 2017-03-28 DIAGNOSIS — H18453 Nodular corneal degeneration, bilateral: Secondary | ICD-10-CM | POA: Diagnosis not present

## 2017-03-28 DIAGNOSIS — J019 Acute sinusitis, unspecified: Secondary | ICD-10-CM | POA: Diagnosis not present

## 2017-03-28 DIAGNOSIS — E119 Type 2 diabetes mellitus without complications: Secondary | ICD-10-CM | POA: Diagnosis not present

## 2017-03-28 DIAGNOSIS — H11153 Pinguecula, bilateral: Secondary | ICD-10-CM | POA: Diagnosis not present

## 2017-03-28 DIAGNOSIS — H25013 Cortical age-related cataract, bilateral: Secondary | ICD-10-CM | POA: Diagnosis not present

## 2017-03-28 DIAGNOSIS — H16223 Keratoconjunctivitis sicca, not specified as Sjogren's, bilateral: Secondary | ICD-10-CM | POA: Diagnosis not present

## 2017-03-28 DIAGNOSIS — H11423 Conjunctival edema, bilateral: Secondary | ICD-10-CM | POA: Diagnosis not present

## 2017-03-28 DIAGNOSIS — H16423 Pannus (corneal), bilateral: Secondary | ICD-10-CM | POA: Diagnosis not present

## 2017-05-06 DIAGNOSIS — G4733 Obstructive sleep apnea (adult) (pediatric): Secondary | ICD-10-CM | POA: Diagnosis not present

## 2017-06-12 ENCOUNTER — Other Ambulatory Visit: Payer: Self-pay | Admitting: Pharmacy Technician

## 2017-06-12 NOTE — Patient Outreach (Signed)
[facility]) Care Management  06/12/2017  Terry Wolfe Jul 13, 1961 [id number]  Incoming [employer] Emmi call in reference to medication adherence. HIPAA identifiers verified and verbal consent received. I verified with Mr. Belshe that he is still taking Olmesartan-Amlodipine-HCTZ and Rosuvastatin. The patient admits that he occasionally misses doses and he does have a pillbox. He does not have any barriers or concerns about his medicine at this time.  Doreene Burke, [place] [phone]

## 2017-06-21 ENCOUNTER — Ambulatory Visit (HOSPITAL_COMMUNITY)
Admission: EM | Admit: 2017-06-21 | Discharge: 2017-06-21 | Disposition: A | Discharge status: Home / Self Care | Payer: PPO | Attending: Family Medicine | Admitting: Family Medicine

## 2017-06-21 ENCOUNTER — Encounter (HOSPITAL_COMMUNITY): Payer: Self-pay | Admitting: Emergency Medicine

## 2017-06-21 ENCOUNTER — Other Ambulatory Visit: Payer: Self-pay

## 2017-06-21 DIAGNOSIS — R0789 Other chest pain: Secondary | ICD-10-CM

## 2017-06-21 MED ORDER — CYCLOBENZAPRINE HCL 5 MG PO TABS: 5.0000 mg | ORAL_TABLET | Freq: Every day | ORAL | 0 refills | Status: DC

## 2017-06-21 MED ORDER — MELOXICAM 7.5 MG PO TABS: 7.5000 mg | ORAL_TABLET | Freq: Every day | ORAL | 1 refills | Status: AC

## 2017-06-21 NOTE — ED Triage Notes (Signed)
Pt c/o pain in his chest on the right side since Monday.

## 2017-06-21 NOTE — Discharge Instructions (Signed)
Please return if pain is not improving over the next several days.

## 2017-06-21 NOTE — ED Provider Notes (Signed)
[facility]   [id number] 06/21/17 Arrival Time: 1033   SUBJECTIVE:  Terry Wolfe is a 56 y.o. male who presents to the urgent care with complaint of pain in his chest on the right side since Monday.   For the last 3 weeks patient has been sneezing frequently and beginning about 5 days ago he developed sharp pain in his right chest.  He notes that every time he takes a deep breath, he has sharp stabbing pain.  Patient has had no recent travels.  He has had no cough or fever.  He does not change his daily routine at all.  He has been Occupational psychologist recently.  Patient has chronic edema in his legs and this is not changed.  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   Past Medical History:  Diagnosis Date  . Abscess    left thigh  . Angina    denied 08/02/13  . Arthritis    "right knuckles"  . Bronchitis   . Chronic bronchitis    "as a child"  . Complication of anesthesia   . Constipation   . Diabetes mellitus without complication ([place])   . Diarrhea   . ED (erectile dysfunction)   . Enlarged prostate with lower urinary tract symptoms (LUTS)    "that's what gave me epididymitis"  . Epididymitis, right ~ 2009  . GERD (gastroesophageal reflux disease)   . H/O hiatal hernia   . History of MRSA infection 03/2011; 11/15/11   left thigh; nasal swab  . Hyperlipidemia   . Hypertension   . Obesity   . Pneumonia    "chronic as a child  . PONV (postoperative nausea and vomiting)   . Shortness of breath dyspnea   . Sleep apnea    diagnosed thru sleep study 06/2013, CPAP ordered   Family History  Problem Relation Age of Onset  . Asthma Other   . Diabetes Other   . Hypertension Other   . Obesity Other   . Sleep apnea Other    Social History   Socioeconomic History  . Marital status: Married    Spouse name: Not on file  . Number of children: Not on file  . Years of education: Not on file  .  Highest education level: Not on file  Social Needs  . Financial resource strain: Not on file  . Food insecurity - worry: Not on file  . Food insecurity - inability: Not on file  . Transportation needs - medical: Not on file  . Transportation needs - non-medical: Not on file  Occupational History  . Not on file  Tobacco Use  . Smoking status: Never Smoker  . Smokeless tobacco: Never Used  Substance and Sexual Activity  . Alcohol use: Yes    Comment: social  . Drug use: No  . Sexual activity: Yes  Other Topics Concern  . Not on file  Social History Narrative  . Not on file   No outpatient medications have been marked as taking for the 06/21/17 encounter [facility] Encounter).   Allergies  Allergen Reactions  . Etodolac Other (See Comments)    Chest pain      ROS: As per HPI, remainder of ROS negative.   OBJECTIVE:   Vitals:   06/21/17 1057  BP: (!) 161/96  Pulse: 70  Resp: 18  Temp: 98.2 F (36.8 C)  SpO2: 100%     General  appearance: alert; no distress Eyes: PERRL; EOMI; conjunctiva normal HENT: normocephalic; atraumatic; TMs normal, canal normal, external ears normal without trauma; nasal mucosa normal; oral mucosa normal Neck: supple Lungs: clear to auscultation bilaterally; nontender Heart: regular rate and rhythm Back: no CVA tenderness Extremities: no cyanosis; symmetrical with no gross deformities; 2+ pedal edema with no tenderness Skin: warm and dry Neurologic: normal gait; grossly normal Psychological: alert and cooperative; normal mood and affect      Labs:  Results for orders placed or performed in visit on 12/13/15  Myocardial Perfusion Imaging  Result Value Ref Range   Rest HR 57 bpm   Rest BP 101/67 mmHg   Exercise duration (min)  min   Exercise duration (sec)  sec   Estimated workload  METS   Peak HR 78 bpm   Peak BP 113/59 mmHg   MPHR  bpm   Percent HR  %   RPE     LV sys vol 73 mL   TID 1.04    LV dias vol 150 62 - 150 mL     LHR 0.20    SSS 4    SRS 4    SDS 0     Labs Reviewed - No data to display  No results found.   EKG:  No changes from prior tracings  ASSESSMENT & PLAN:  1. Right-sided chest wall pain     Meds ordered this encounter  Medications  . cyclobenzaprine (FLEXERIL) 5 MG tablet    Sig: Take 1 tablet (5 mg total) by mouth at bedtime.    Dispense:  7 tablet    Refill:  0  . meloxicam (MOBIC) 7.5 MG tablet    Sig: Take 1 tablet (7.5 mg total) by mouth daily.    Dispense:  7 tablet    Refill:  1    Reviewed expectations re: course of current medical issues. Questions answered. Outlined signs and symptoms indicating need for more acute intervention. Patient verbalized understanding. After Visit Summary given.    Procedures:      Robyn Haber, MD 06/21/17 1136

## 2017-07-10 ENCOUNTER — Telehealth: Payer: PPO | Admitting: Family

## 2017-07-10 DIAGNOSIS — R197 Diarrhea, unspecified: Secondary | ICD-10-CM

## 2017-07-10 NOTE — Progress Notes (Signed)
Based on what you shared with me it looks like you have a serious condition that should be evaluated in a face to face office visit.  NOTE: Even if you have entered your credit card information for this eVisit, you will not be charged.   If you are having a true medical emergency please call 911.  If you need an urgent face to face visit, [facility] has four urgent care centers for your convenience.  If you need care fast and have a high deductible or no insurance consider:   [email or web address]  [phone]  [address], [address] [place], [place] [address] 8 am to 8 pm Monday-Friday 10 am to 4 pm Saturday-Sunday   The following sites will take your  insurance:    . [facility]  [phone] Get Driving Directions Find a Provider at this Location  [address] [place], [place] [address] . 10 am to 8 pm Monday-Friday . 12 pm to 8 pm Saturday-Sunday   . [facility]  [phone] Get Driving Directions Find a Provider at this Location  [address], [address] [place], [place] [address] . 8 am to 8 pm Monday-Friday . 9 am to 6 pm Saturday . 11 am to 6 pm Sunday   .  [facility] [place]  [phone] Get Driving Directions  [address] [place],  [address] . 8 am to 8 pm Monday-Friday . 8 am to 4 pm Saturday-Sunday   Your e-visit answers were reviewed by a board certified advanced clinical practitioner to complete your personal care plan.  Thank you for using e-Visits.  

## 2017-07-11 DIAGNOSIS — R110 Nausea: Secondary | ICD-10-CM | POA: Diagnosis not present

## 2017-07-11 DIAGNOSIS — R197 Diarrhea, unspecified: Secondary | ICD-10-CM | POA: Diagnosis not present

## 2017-07-19 ENCOUNTER — Other Ambulatory Visit: Payer: Self-pay | Admitting: Physician Assistant

## 2017-07-19 DIAGNOSIS — R110 Nausea: Secondary | ICD-10-CM

## 2017-07-19 DIAGNOSIS — R1010 Upper abdominal pain, unspecified: Secondary | ICD-10-CM

## 2017-07-19 DIAGNOSIS — R197 Diarrhea, unspecified: Secondary | ICD-10-CM | POA: Diagnosis not present

## 2017-07-24 ENCOUNTER — Ambulatory Visit
Admission: RE | Admit: 2017-07-24 | Discharge: 2017-07-24 | Disposition: A | Discharge status: Home / Self Care | Payer: PPO | Source: Ambulatory Visit | Attending: Physician Assistant | Admitting: Physician Assistant

## 2017-07-24 DIAGNOSIS — R109 Unspecified abdominal pain: Secondary | ICD-10-CM | POA: Diagnosis not present

## 2017-07-24 DIAGNOSIS — R1010 Upper abdominal pain, unspecified: Secondary | ICD-10-CM

## 2017-07-24 DIAGNOSIS — R197 Diarrhea, unspecified: Secondary | ICD-10-CM | POA: Diagnosis not present

## 2017-07-24 DIAGNOSIS — R110 Nausea: Secondary | ICD-10-CM

## 2017-07-31 DIAGNOSIS — H11153 Pinguecula, bilateral: Secondary | ICD-10-CM | POA: Diagnosis not present

## 2017-07-31 DIAGNOSIS — H10413 Chronic giant papillary conjunctivitis, bilateral: Secondary | ICD-10-CM | POA: Diagnosis not present

## 2017-07-31 DIAGNOSIS — H35033 Hypertensive retinopathy, bilateral: Secondary | ICD-10-CM | POA: Diagnosis not present

## 2017-07-31 DIAGNOSIS — H18453 Nodular corneal degeneration, bilateral: Secondary | ICD-10-CM | POA: Diagnosis not present

## 2017-07-31 DIAGNOSIS — H40023 Open angle with borderline findings, high risk, bilateral: Secondary | ICD-10-CM | POA: Diagnosis not present

## 2017-07-31 DIAGNOSIS — E119 Type 2 diabetes mellitus without complications: Secondary | ICD-10-CM | POA: Diagnosis not present

## 2017-07-31 DIAGNOSIS — H04123 Dry eye syndrome of bilateral lacrimal glands: Secondary | ICD-10-CM | POA: Diagnosis not present

## 2017-07-31 DIAGNOSIS — H25042 Posterior subcapsular polar age-related cataract, left eye: Secondary | ICD-10-CM | POA: Diagnosis not present

## 2017-07-31 DIAGNOSIS — H2513 Age-related nuclear cataract, bilateral: Secondary | ICD-10-CM | POA: Diagnosis not present

## 2017-07-31 DIAGNOSIS — Z794 Long term (current) use of insulin: Secondary | ICD-10-CM | POA: Diagnosis not present

## 2017-07-31 DIAGNOSIS — H25013 Cortical age-related cataract, bilateral: Secondary | ICD-10-CM | POA: Diagnosis not present

## 2017-07-31 DIAGNOSIS — H11423 Conjunctival edema, bilateral: Secondary | ICD-10-CM | POA: Diagnosis not present

## 2017-08-07 DIAGNOSIS — I10 Essential (primary) hypertension: Secondary | ICD-10-CM | POA: Diagnosis not present

## 2017-08-07 DIAGNOSIS — E7800 Pure hypercholesterolemia, unspecified: Secondary | ICD-10-CM | POA: Diagnosis not present

## 2017-08-07 DIAGNOSIS — Z6841 Body Mass Index (BMI) 40.0 and over, adult: Secondary | ICD-10-CM | POA: Diagnosis not present

## 2017-08-07 DIAGNOSIS — R197 Diarrhea, unspecified: Secondary | ICD-10-CM | POA: Diagnosis not present

## 2017-08-07 DIAGNOSIS — E119 Type 2 diabetes mellitus without complications: Secondary | ICD-10-CM | POA: Diagnosis not present

## 2017-08-07 DIAGNOSIS — Z23 Encounter for immunization: Secondary | ICD-10-CM | POA: Diagnosis not present

## 2017-08-12 ENCOUNTER — Ambulatory Visit (INDEPENDENT_AMBULATORY_CARE_PROVIDER_SITE_OTHER): Payer: PPO

## 2017-08-12 ENCOUNTER — Ambulatory Visit (HOSPITAL_COMMUNITY)
Admission: EM | Admit: 2017-08-12 | Discharge: 2017-08-12 | Disposition: A | Discharge status: Home / Self Care | Payer: PPO | Attending: Family Medicine | Admitting: Family Medicine

## 2017-08-12 ENCOUNTER — Encounter (HOSPITAL_COMMUNITY): Payer: Self-pay | Admitting: Emergency Medicine

## 2017-08-12 DIAGNOSIS — Z7982 Long term (current) use of aspirin: Secondary | ICD-10-CM | POA: Diagnosis not present

## 2017-08-12 DIAGNOSIS — J029 Acute pharyngitis, unspecified: Secondary | ICD-10-CM | POA: Diagnosis not present

## 2017-08-12 DIAGNOSIS — Z96651 Presence of right artificial knee joint: Secondary | ICD-10-CM | POA: Insufficient documentation

## 2017-08-12 DIAGNOSIS — R0789 Other chest pain: Secondary | ICD-10-CM | POA: Diagnosis not present

## 2017-08-12 DIAGNOSIS — R69 Illness, unspecified: Secondary | ICD-10-CM

## 2017-08-12 DIAGNOSIS — R062 Wheezing: Secondary | ICD-10-CM | POA: Diagnosis not present

## 2017-08-12 DIAGNOSIS — R509 Fever, unspecified: Secondary | ICD-10-CM | POA: Diagnosis not present

## 2017-08-12 DIAGNOSIS — R51 Headache: Secondary | ICD-10-CM | POA: Diagnosis not present

## 2017-08-12 DIAGNOSIS — E119 Type 2 diabetes mellitus without complications: Secondary | ICD-10-CM | POA: Insufficient documentation

## 2017-08-12 DIAGNOSIS — I10 Essential (primary) hypertension: Secondary | ICD-10-CM | POA: Diagnosis not present

## 2017-08-12 DIAGNOSIS — J1100 Influenza due to unidentified influenza virus with unspecified type of pneumonia: Secondary | ICD-10-CM | POA: Insufficient documentation

## 2017-08-12 DIAGNOSIS — J111 Influenza due to unidentified influenza virus with other respiratory manifestations: Secondary | ICD-10-CM

## 2017-08-12 DIAGNOSIS — E785 Hyperlipidemia, unspecified: Secondary | ICD-10-CM | POA: Diagnosis not present

## 2017-08-12 DIAGNOSIS — R05 Cough: Secondary | ICD-10-CM

## 2017-08-12 DIAGNOSIS — K219 Gastro-esophageal reflux disease without esophagitis: Secondary | ICD-10-CM | POA: Insufficient documentation

## 2017-08-12 DIAGNOSIS — J069 Acute upper respiratory infection, unspecified: Secondary | ICD-10-CM | POA: Diagnosis present

## 2017-08-12 DIAGNOSIS — G4733 Obstructive sleep apnea (adult) (pediatric): Secondary | ICD-10-CM | POA: Insufficient documentation

## 2017-08-12 DIAGNOSIS — Z79899 Other long term (current) drug therapy: Secondary | ICD-10-CM | POA: Insufficient documentation

## 2017-08-12 DIAGNOSIS — R0602 Shortness of breath: Secondary | ICD-10-CM | POA: Diagnosis not present

## 2017-08-12 LAB — POCT RAPID STREP A: STREPTOCOCCUS, GROUP A SCREEN (DIRECT): NEGATIVE

## 2017-08-12 MED ORDER — FLUTICASONE PROPIONATE 50 MCG/ACT NA SUSP: 2.0000 | Freq: Every day | NASAL | 0 refills | Status: AC

## 2017-08-12 MED ORDER — IPRATROPIUM-ALBUTEROL 0.5-2.5 (3) MG/3ML IN SOLN
RESPIRATORY_TRACT | Status: AC
  Filled 2017-08-12: qty 3

## 2017-08-12 MED ORDER — BENZONATATE 100 MG PO CAPS: 100.0000 mg | ORAL_CAPSULE | Freq: Three times a day (TID) | ORAL | 0 refills | Status: DC

## 2017-08-12 MED ORDER — CETIRIZINE-PSEUDOEPHEDRINE ER 5-120 MG PO TB12: 1.0000 | ORAL_TABLET | Freq: Every day | ORAL | 0 refills | Status: DC

## 2017-08-12 MED ORDER — IPRATROPIUM-ALBUTEROL 0.5-2.5 (3) MG/3ML IN SOLN
3.0000 mL | Freq: Once | RESPIRATORY_TRACT | Status: AC
  Administered 2017-08-12: 3 mL via RESPIRATORY_TRACT

## 2017-08-12 MED ORDER — HYDROCOD POLST-CPM POLST ER 10-8 MG/5ML PO SUER: 5.0000 mL | Freq: Every evening | ORAL | 0 refills | Status: DC | PRN

## 2017-08-12 NOTE — Discharge Instructions (Signed)
X-ray negative for pneumonia.  Rapid strep negative.  Symptoms could be due to a virus passing through.  Tessalon for cough.  Tussionex at night.  Start flonase, zyrtec-D for nasal congestion. You can use over the counter nasal saline rinse such as neti pot for nasal congestion. Keep hydrated, your urine should be clear to pale yellow in color. Tylenol/motrin for fever and pain. Monitor for any worsening of symptoms, chest pain, shortness of breath, wheezing, swelling of the throat, follow up for reevaluation.   For sore throat try using a honey-based tea. Use 3 teaspoons of honey with juice squeezed from half lemon. Place shaved pieces of ginger into 1/2-1 cup of water and warm over stove top. Then mix the ingredients and repeat every 4 hours as needed.

## 2017-08-12 NOTE — ED Triage Notes (Signed)
PT C/O: cold sx onset 4 days associated w/HA, sore throat, chest tightness, fever, nasal drainage  Had flu shot las week.   TAKING MEDS: flonase, cetirizine   A&O x4... NAD... Ambulatory

## 2017-08-12 NOTE — ED Provider Notes (Signed)
[facility]    CSN: [id number] Arrival date & time: 08/12/17  1034     History   Chief Complaint Chief Complaint  Patient presents with  . URI    HPI Terry Wolfe is a 57 y.o. male.   57 year old male with history of HTN, HLD, OSA with CPAP, DM comes in for 4-day history of URI symptoms.  He has had headache, sore throat, chest tightness, fever, nasal drainage, productive cough. Fever, Tmax 102, last fever yesterday. Taking over-the-counter Flonase, Zyrtec, motrin, tylenol. Chest pain with cough. Denies shortness of breath, swelling in the legs, orthopnea. Never smoker.   States he saw his PCP last week for annual check up and was "given a clean bill of health", got flu shot at that time. States last a1c 6.6      Past Medical History:  Diagnosis Date  . Abscess    left thigh  . Angina    denied 08/02/13  . Arthritis    "right knuckles"  . Bronchitis   . Chronic bronchitis    "as a child"  . Complication of anesthesia   . Constipation   . Diabetes mellitus without complication ([place])   . Diarrhea   . ED (erectile dysfunction)   . Enlarged prostate with lower urinary tract symptoms (LUTS)    "that's what gave me epididymitis"  . Epididymitis, right ~ 2009  . GERD (gastroesophageal reflux disease)   . H/O hiatal hernia   . History of MRSA infection 03/2011; 11/15/11   left thigh; nasal swab  . Hyperlipidemia   . Hypertension   . Obesity   . Pneumonia    "chronic as a child  . PONV (postoperative nausea and vomiting)   . Shortness of breath dyspnea   . Sleep apnea    diagnosed thru sleep study 06/2013, CPAP ordered    Patient Active Problem List   Diagnosis Date Noted  . Chest pain 11/30/2015  . Primary osteoarthritis of right knee 02/21/2015  . Sleep apnea 02/21/2015  . Severe obesity (BMI >= 40) ([place]) 02/21/2015  . Spinal stenosis 08/13/2013  . Thrombosed external hemorrhoid-left  01/03/2012  . Felon, left 11/15/2011  . Abscess of thigh  04/04/2011    Past Surgical History:  Procedure Laterality Date  . ANTERIOR FUSION CERVICAL SPINE  04/14/2013   Dr. Lynann Bologna  . APPENDECTOMY  1977  . COLONOSCOPY    . DECOMPRESSIVE LUMBAR LAMINECTOMY LEVEL 2 N/A 08/13/2013   Procedure: DECOMPRESSIVE LUMBAR LAMINECTOMY LEVEL 2;  Surgeon: Sinclair Ship, MD;  Location: [facility];  Service: Orthopedics;  Laterality: N/A;  Lumbar 4-5, lumbar 5-sacrum 1 decompression  . FINGER FRACTURE SURGERY  09/28/2001   left middle finger  . HERNIA REPAIR  2005  . I&D EXTREMITY  11/15/2011   Procedure: IRRIGATION AND DEBRIDEMENT EXTREMITY;  Surgeon: Mcarthur Rossetti, MD;  Location: [facility];  Service: Orthopedics;  Laterality: Left;  Irrigation and debridement left thumb  . INCISE AND DRAIN ABCESS  03/2011   left thigh  . INCISION AND DRAINAGE OF WOUND  1990's; 11/15/11   right; left  . PILONIDAL CYST EXCISION  1980s   removed from lower back  . STAPLE HEMORRHOIDECTOMY  09/07/2008   [place] Dr Zettie Pho  . TOTAL KNEE ARTHROPLASTY Right 02/21/2015   Procedure: RIGHT TOTAL KNEE ARTHROPLASTY;  Surgeon: Dorna Leitz, MD;  Location: [facility];  Service: Orthopedics;  Laterality: Right;  . TRACHEOSTOMY  1972   trach for 4 days as child for accident  .  UMBILICAL HERNIA REPAIR  01/26/2004   Ventralex - Dr Rebekah Chesterfield       Home Medications    Prior to Admission medications   Medication Sig Start Date End Date Taking? Authorizing Provider  aspirin EC 325 MG tablet Take 1 tablet (325 mg total) by mouth 2 (two) times daily after a meal. Take x 1 month post op to decrease risk of blood clots. 02/21/15  Yes Gary Fleet, PA-C  cetirizine (ZYRTEC) 10 MG tablet Take 10 mg by mouth daily.   Yes [provider]  cloNIDine (CATAPRES) 0.1 MG tablet Take 0.1 mg by mouth 2 (two) times daily. 07/06/15  Yes [provider]  Dulaglutide (TRULICITY) 1.5 PI/9.5JO SOPN Inject 1.5 mg into the skin once a week. On Saturday   Yes [provider]    Glucos-Chondroit-Collag-Hyal (GLUCOSAMINE CHONDROIT-COLLAGEN) CAPS Take 1,000 mg by mouth daily.   Yes [provider]  hydrocortisone 2.5 % cream Apply topically 2 (two) times daily as needed (itching). 07/01/14  Yes Street, [place], PA-C  hydrOXYzine (ATARAX/VISTARIL) 25 MG tablet Take 1 tablet (25 mg total) by mouth every 6 (six) hours as needed for itching. Patient taking differently: Take 25 mg by mouth at bedtime as needed for itching (sleep).  07/01/14  Yes Street, [place], PA-C  meclizine (ANTIVERT) 25 MG tablet Take 1 tablet by mouth at bedtime. 10/31/15  Yes [provider]  meloxicam (MOBIC) 7.5 MG tablet Take 1 tablet (7.5 mg total) by mouth daily. 06/21/17  Yes Robyn Haber, MD  mometasone (NASONEX) 50 MCG/ACT nasal spray Place 1 spray into both nostrils at bedtime.   Yes [provider]  Multiple Vitamin (MULITIVITAMIN WITH MINERALS) TABS Take 1 tablet by mouth daily.   Yes [provider]  nebivolol (BYSTOLIC) 10 MG tablet Take 20 mg by mouth daily.   Yes [provider]  Olmesartan-Amlodipine-HCTZ (TRIBENZOR) 40-10-25 MG TABS Take 1 tablet by mouth daily.   Yes [provider]  OXYGEN as directed. cpap 12/09/13  Yes [provider]  pantoprazole (PROTONIX) 40 MG tablet Take 40 mg by mouth daily. 10/31/15  Yes [provider]  rosuvastatin (CRESTOR) 10 MG tablet Take 10 mg by mouth daily.   Yes [provider]  sildenafil (VIAGRA) 50 MG tablet Take 50 mg by mouth as needed. For erectile dysfunction   Yes [provider]  tadalafil (CIALIS) 5 MG tablet Take 5 mg by mouth daily.    Yes [provider]  Tamsulosin HCl (FLOMAX) 0.4 MG CAPS Take 0.4 mg by mouth daily after breakfast. For urges to urinate   Yes [provider]  Vitamins-Lipotropics (LIPO-FLAVONOID PLUS) TABS Take 1 tablet by mouth at bedtime as needed (ringing in ears).   Yes [provider]  zolpidem  (AMBIEN) 10 MG tablet Take 10 mg by mouth at bedtime.   Yes [provider]  benzonatate (TESSALON) 100 MG capsule Take 1 capsule (100 mg total) by mouth every 8 (eight) hours. 08/12/17   Tasia Catchings, Isabellarose Kope V, PA-C  cetirizine-pseudoephedrine (ZYRTEC-D) 5-120 MG tablet Take 1 tablet by mouth daily. 08/12/17   Tasia Catchings, Amiya Escamilla V, PA-C  chlorpheniramine-HYDROcodone (TUSSIONEX PENNKINETIC ER) 10-8 MG/5ML SUER Take 5 mLs by mouth at bedtime as needed for cough. 08/12/17   Tasia Catchings, [place] Romey V, PA-C  cyclobenzaprine (FLEXERIL) 5 MG tablet Take 1 tablet (5 mg total) by mouth at bedtime. 06/21/17   Robyn Haber, MD  fluticasone (FLONASE) 50 MCG/ACT nasal spray Place 2 sprays into both nostrils daily. 08/12/17  Tasia Catchings, Braylon Lemmons V, PA-C  Misc Natural Products (GLUCOSAMINE CHONDROITIN TRIPLE) TABS Take 1 tablet by mouth daily.    [provider]    Family History Family History  Problem Relation Age of Onset  . Asthma Other   . Diabetes Other   . Hypertension Other   . Obesity Other   . Sleep apnea Other     Social History Social History   Tobacco Use  . Smoking status: Never Smoker  . Smokeless tobacco: Never Used  Substance Use Topics  . Alcohol use: Yes    Comment: social  . Drug use: No     Allergies   Etodolac   Review of Systems Review of Systems  Reason unable to perform ROS: See HPI as above.     Physical Exam Triage Vital Signs ED Triage Vitals  Enc Vitals Group     BP 08/12/17 1206 139/72     Pulse Rate 08/12/17 1206 79     Resp 08/12/17 1206 20     Temp 08/12/17 1206 99.3 F (37.4 C)     Temp Source 08/12/17 1206 Oral     SpO2 08/12/17 1206 98 %     Weight --      Height --      Head Circumference --      Peak Flow --      Pain Score 08/12/17 1207 8     Pain Loc --      Pain Edu? --      Excl. in [place]? --    No data found.  Updated Vital Signs BP 139/72 (BP Location: Left Arm)   Pulse 79   Temp 99.3 F (37.4 C) (Oral)   Resp 20   SpO2 98%   Physical Exam    Constitutional: He is oriented to person, place, and time. He appears well-developed and well-nourished. No distress.  HENT:  Head: Normocephalic and atraumatic.  Right Ear: Tympanic membrane, external ear and ear canal normal. Tympanic membrane is not erythematous and not bulging.  Left Ear: Tympanic membrane, external ear and ear canal normal. Tympanic membrane is not erythematous and not bulging.  Nose: Right sinus exhibits frontal sinus tenderness. Right sinus exhibits no maxillary sinus tenderness. Left sinus exhibits frontal sinus tenderness. Left sinus exhibits no maxillary sinus tenderness.  Mouth/Throat: Uvula is midline, oropharynx is clear and moist and mucous membranes are normal.  Eyes: Conjunctivae are normal. Pupils are equal, round, and reactive to light.  Neck: Normal range of motion. Neck supple.  Cardiovascular: Normal rate, regular rhythm and normal heart sounds. Exam reveals no gallop and no friction rub.  No murmur heard. Pulmonary/Chest: Effort normal.  Patient coughing throughout exam.  Coarse breath sounds throughout.  Mild wheezing with crackles on the left upper field.  Lymphadenopathy:    He has no cervical adenopathy.  Neurological: He is alert and oriented to person, place, and time.  Skin: Skin is warm and dry.  Psychiatric: He has a normal mood and affect. His behavior is normal. Judgment normal.    UC Treatments / Results  Labs (all labs ordered are listed, but only abnormal results are displayed) Labs Reviewed  CULTURE, GROUP A STREP [facility])  POCT RAPID STREP A    EKG  EKG Interpretation None       Radiology Dg Chest 2 View  Result Date: 08/12/2017 CLINICAL DATA:  Four days of cough associated with wheezing, shortness of breath, and chest tightness. History of previous episodes of pneumonia and  bronchitis. Never smoked. EXAM: CHEST  2 VIEW COMPARISON:  Chest x-ray of February 14, 2015 FINDINGS: The lungs are adequately inflated. There is no focal  infiltrate. There is no pleural effusion. The cardiac silhouette is top-normal in size. The pulmonary vascularity is not engorged. The mediastinum is normal in width. There is mild multilevel degenerative disc space narrowing of the thoracic spine. IMPRESSION: There is no pneumonia nor other acute cardiopulmonary abnormality. Electronically Signed   By: David  Martinique M.D.   On: 08/12/2017 12:40    Procedures Procedures (including critical care time)  Medications Ordered in UC Medications  ipratropium-albuterol (DUONEB) 0.5-2.5 (3) MG/3ML nebulizer solution 3 mL (3 mLs Nebulization Given 08/12/17 1308)     Initial Impression / Assessment and Plan / UC Course  I have reviewed the triage vital signs and the nursing notes.  Pertinent labs & imaging results that were available during my care of the patient were reviewed by me and considered in my medical decision making (see chart for details).    Chest x-ray negative for pneumonia.  Rapid strep negative.  Patient with lungs clear to auscultation with resolution of wheezing post duoneb.  Discussed with patient symptoms most likely due to virus.  Symptomatic treatment is discussed.  Return precautions given.  Patient expresses understanding and agrees to plan.  Final Clinical Impressions(s) / UC Diagnoses   Final diagnoses:  Influenza-like illness    ED Discharge Orders        Ordered    fluticasone (FLONASE) 50 MCG/ACT nasal spray  Daily     08/12/17 1332    cetirizine-pseudoephedrine (ZYRTEC-D) 5-120 MG tablet  Daily     08/12/17 1332    benzonatate (TESSALON) 100 MG capsule  Every 8 hours     08/12/17 1332    chlorpheniramine-HYDROcodone (TUSSIONEX PENNKINETIC ER) 10-8 MG/5ML SUER  At bedtime PRN     08/12/17 1332        Ok Edwards, PA-C 08/12/17 1341

## 2017-08-14 LAB — CULTURE, GROUP A STREP (THRC)

## 2018-03-14 DIAGNOSIS — G4733 Obstructive sleep apnea (adult) (pediatric): Secondary | ICD-10-CM | POA: Diagnosis not present

## 2018-05-19 DIAGNOSIS — E7800 Pure hypercholesterolemia, unspecified: Secondary | ICD-10-CM | POA: Diagnosis not present

## 2018-05-19 DIAGNOSIS — Z0000 Encounter for general adult medical examination without abnormal findings: Secondary | ICD-10-CM | POA: Diagnosis not present

## 2018-05-19 DIAGNOSIS — Z23 Encounter for immunization: Secondary | ICD-10-CM | POA: Diagnosis not present

## 2018-05-19 DIAGNOSIS — Z125 Encounter for screening for malignant neoplasm of prostate: Secondary | ICD-10-CM | POA: Diagnosis not present

## 2018-05-19 DIAGNOSIS — I10 Essential (primary) hypertension: Secondary | ICD-10-CM | POA: Diagnosis not present

## 2018-05-19 DIAGNOSIS — K580 Irritable bowel syndrome with diarrhea: Secondary | ICD-10-CM | POA: Diagnosis not present

## 2018-05-19 DIAGNOSIS — K219 Gastro-esophageal reflux disease without esophagitis: Secondary | ICD-10-CM | POA: Diagnosis not present

## 2018-05-19 DIAGNOSIS — N400 Enlarged prostate without lower urinary tract symptoms: Secondary | ICD-10-CM | POA: Diagnosis not present

## 2018-05-19 DIAGNOSIS — E1169 Type 2 diabetes mellitus with other specified complication: Secondary | ICD-10-CM | POA: Diagnosis not present

## 2018-05-19 DIAGNOSIS — G4733 Obstructive sleep apnea (adult) (pediatric): Secondary | ICD-10-CM | POA: Diagnosis not present

## 2018-05-19 DIAGNOSIS — M4800 Spinal stenosis, site unspecified: Secondary | ICD-10-CM | POA: Diagnosis not present

## 2018-06-23 DIAGNOSIS — G4733 Obstructive sleep apnea (adult) (pediatric): Secondary | ICD-10-CM | POA: Diagnosis not present

## 2018-06-23 DIAGNOSIS — K921 Melena: Secondary | ICD-10-CM | POA: Diagnosis not present

## 2018-06-24 DIAGNOSIS — G4733 Obstructive sleep apnea (adult) (pediatric): Secondary | ICD-10-CM | POA: Diagnosis not present

## 2018-07-17 DIAGNOSIS — K640 First degree hemorrhoids: Secondary | ICD-10-CM | POA: Diagnosis not present

## 2018-07-17 DIAGNOSIS — K921 Melena: Secondary | ICD-10-CM | POA: Diagnosis not present

## 2018-08-06 DIAGNOSIS — H1045 Other chronic allergic conjunctivitis: Secondary | ICD-10-CM | POA: Diagnosis not present

## 2018-08-06 DIAGNOSIS — H25013 Cortical age-related cataract, bilateral: Secondary | ICD-10-CM | POA: Diagnosis not present

## 2018-08-06 DIAGNOSIS — H11153 Pinguecula, bilateral: Secondary | ICD-10-CM | POA: Diagnosis not present

## 2018-08-06 DIAGNOSIS — H40023 Open angle with borderline findings, high risk, bilateral: Secondary | ICD-10-CM | POA: Diagnosis not present

## 2018-08-06 DIAGNOSIS — H01026 Squamous blepharitis left eye, unspecified eyelid: Secondary | ICD-10-CM | POA: Diagnosis not present

## 2018-08-06 DIAGNOSIS — H2513 Age-related nuclear cataract, bilateral: Secondary | ICD-10-CM | POA: Diagnosis not present

## 2018-08-06 DIAGNOSIS — H18413 Arcus senilis, bilateral: Secondary | ICD-10-CM | POA: Diagnosis not present

## 2018-08-06 DIAGNOSIS — H01023 Squamous blepharitis right eye, unspecified eyelid: Secondary | ICD-10-CM | POA: Diagnosis not present

## 2018-08-06 DIAGNOSIS — H16223 Keratoconjunctivitis sicca, not specified as Sjogren's, bilateral: Secondary | ICD-10-CM | POA: Diagnosis not present

## 2018-08-12 DIAGNOSIS — L089 Local infection of the skin and subcutaneous tissue, unspecified: Secondary | ICD-10-CM | POA: Diagnosis not present

## 2018-08-12 DIAGNOSIS — L723 Sebaceous cyst: Secondary | ICD-10-CM | POA: Diagnosis not present

## 2018-09-12 ENCOUNTER — Ambulatory Visit: Payer: Self-pay | Admitting: Surgery

## 2018-09-12 DIAGNOSIS — L723 Sebaceous cyst: Secondary | ICD-10-CM | POA: Diagnosis not present

## 2018-09-12 DIAGNOSIS — L089 Local infection of the skin and subcutaneous tissue, unspecified: Secondary | ICD-10-CM | POA: Diagnosis not present

## 2018-09-12 NOTE — H&P (Signed)
Terry Wolfe Documented: 09/12/2018 9:10 AM Location: [facility] Patient #: [id number] DOB: [birth year] Married / Language: Terry Wolfe / Race: White Male  History of Present Illness Terry Wolfe A. Terry Klahr MD; 09/12/2018 11:36 AM) Patient words: Patient sent patient returns after incision and drainage of recurrent epidermal inclusion cyst on his posterior neck. Recurrence of the cyst was drained 2017. He is doing well without redness, pain or discharge.  The patient is a 58 year old male.   Allergies (Terry Wolfe, RMA; 09/12/2018 9:11 AM) Etodolac *ANALGESICS - ANTI-INFLAMMATORY* Dermatitis. Allergies Reconciled  Medication History (Terry Wolfe, RMA; 09/12/2018 9:11 AM) metFORMIN HCl ER (500MG  Tablet ER 24HR, Oral) Active. Bydureon (2MG  Pen-injector, Subcutaneous) Active. Bystolic (20MG  Tablet, Oral) Active. Cialis (5MG  Tablet, Oral) Active. Tamsulosin HCl (0.4MG  Capsule, Oral) Active. Rosuvastatin Calcium (10MG  Tablet, Oral) Active. Pantoprazole Sodium (40MG  Tablet DR, Oral) Active. Meloxicam (15MG  Tablet, Oral) Active. Olmesartan-Amlodipine-HCTZ (40-10-25MG  Tablet, Oral) Active. Medications Reconciled    Vitals (Terry Wolfe RMA; 09/12/2018 9:10 AM) 09/12/2018 9:10 AM Weight: 327.4 lb Height: 70in Body Surface Area: 2.57 m Body Mass Index: 46.98 kg/m  Temp.: 98.70F  Pulse: 65 (Regular)  BP: 132/78 (Sitting, Left Arm, Standard)      Physical Exam (Terry Crouse A. Anabela Crayton MD; 09/12/2018 11:36 AM)  General Mental Status-Alert. General Appearance-Consistent with stated age. Hydration-Well hydrated. Voice-Normal.  Integumentary Note: 3 cm x 5 cm epidermal inclusion cyst posterior neck no redness or inflammation today. No fluctuance.  Chest and Lung Exam Chest and lung exam reveals -quiet, even and easy respiratory effort with no use of accessory muscles and on auscultation, normal breath sounds, no adventitious sounds and  normal vocal resonance. Inspection Chest Wall - Normal. Back - normal.  Cardiovascular Cardiovascular examination reveals -on palpation PMI is normal in location and amplitude, no palpable S3 or S4. Normal cardiac borders., normal heart sounds, regular rate and rhythm with no murmurs, carotid auscultation reveals no bruits and normal pedal pulses bilaterally.    Assessment & Plan (Terry Klutts A. Adelai Achey MD; 09/12/2018 11:37 AM)  INFECTED SEBACEOUS CYST (L72.3) Impression: He underwent repeat I&D for infected sebaceous cyst on right posterior neck in urgent office on 08/13/18. Patient is recurrence therefore recommended wide excision as an outpatient. Risk and benefits of surgery discussed with patient today. Other treatment options discussed with patient today.  Current Plans Pt Education - [facility] Free Text Education/Instructions: discussed with patient and provided information. The pathophysiology of skin & subcutaneous masses was discussed. Natural history risks without surgery were discussed. I recommended surgery to remove the mass. I explained the technique of removal with use of local anesthesia & possible need for more aggressive sedation/anesthesia for patient comfort.  Risks such as bleeding, infection, wound breakdown, heart attack, death, and other risks were discussed. I noted a good likelihood this will help address the problem. Possibility that this will not correct all symptoms was explained. Possibility of regrowth/recurrence of the mass was discussed. We will work to minimize complications. Questions were answered. The patient expresses understanding & wishes to proceed with surgery.

## 2018-10-04 IMAGING — DX DG CHEST 2V
2 series · 2 of 2 positions shown · non-contrast
Comparison: Chest x-ray of February 14, 2015

CLINICAL DATA: Four days of cough associated with wheezing,
shortness of breath, and chest tightness. History of previous
episodes of pneumonia and bronchitis. Never smoked.

EXAM:
CHEST  2 VIEW

[chest pa]
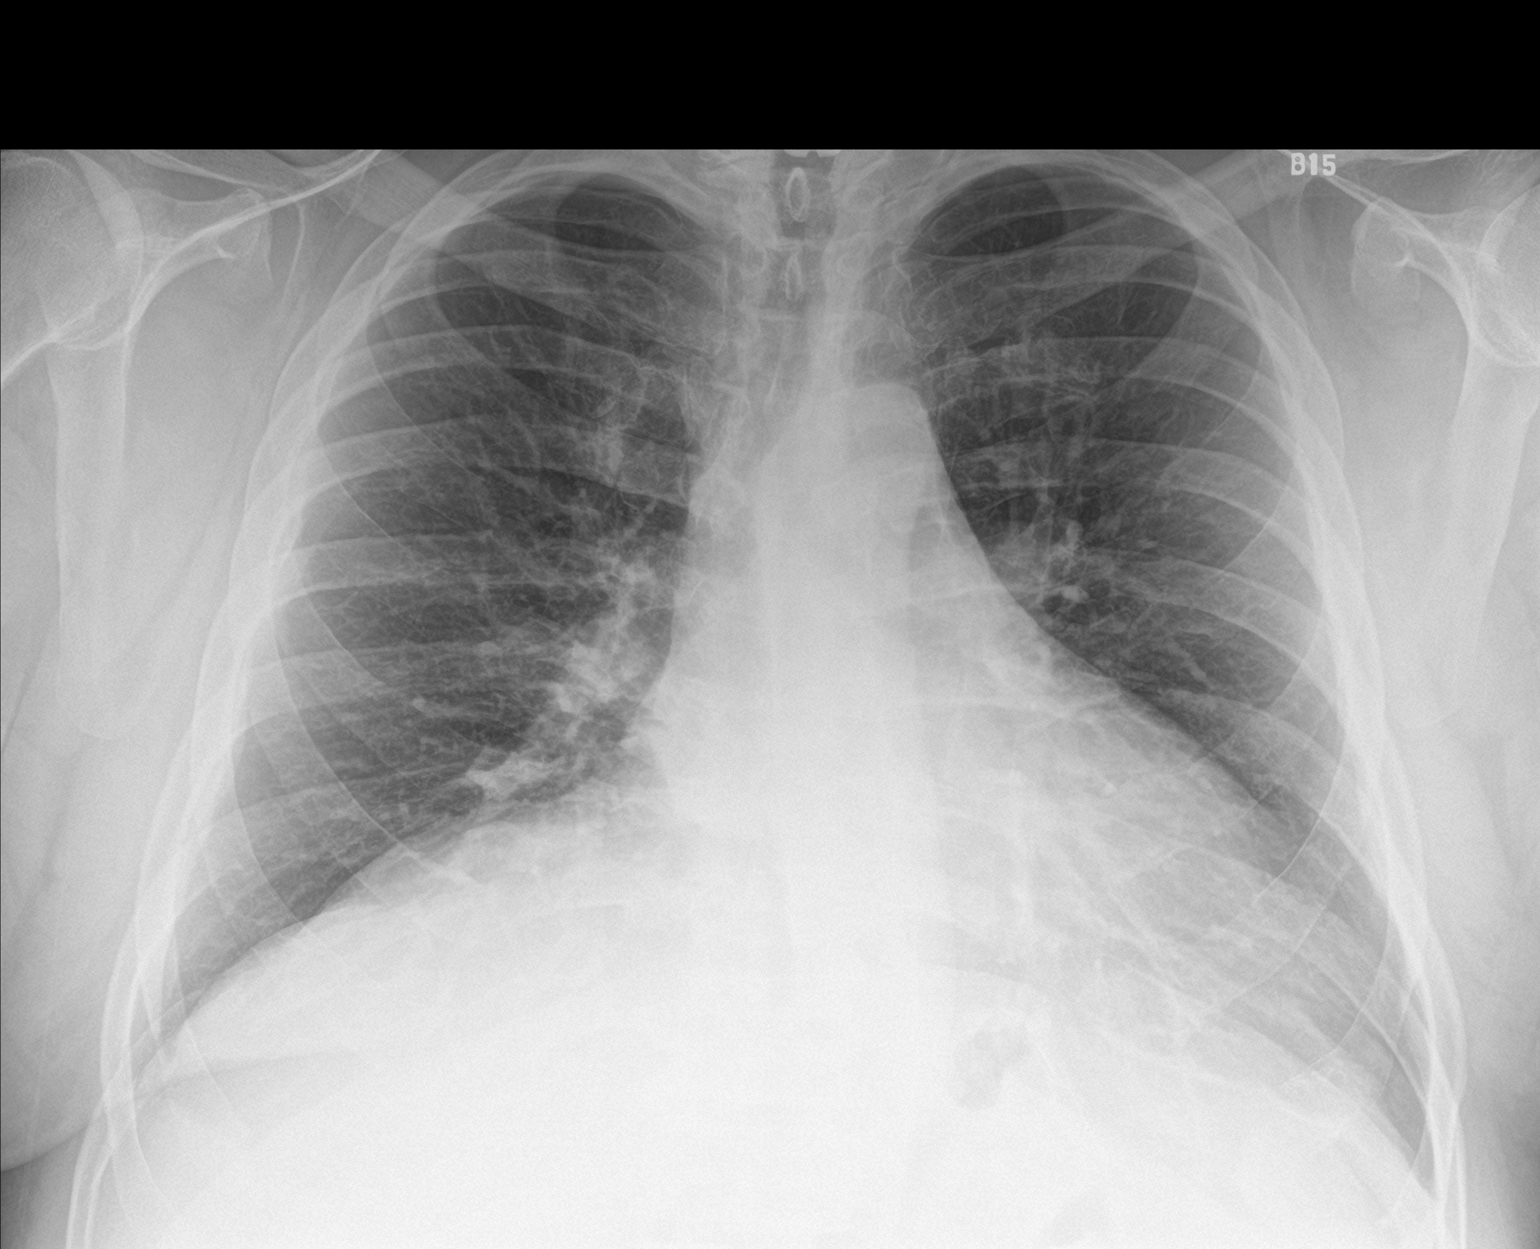

[chest lat]
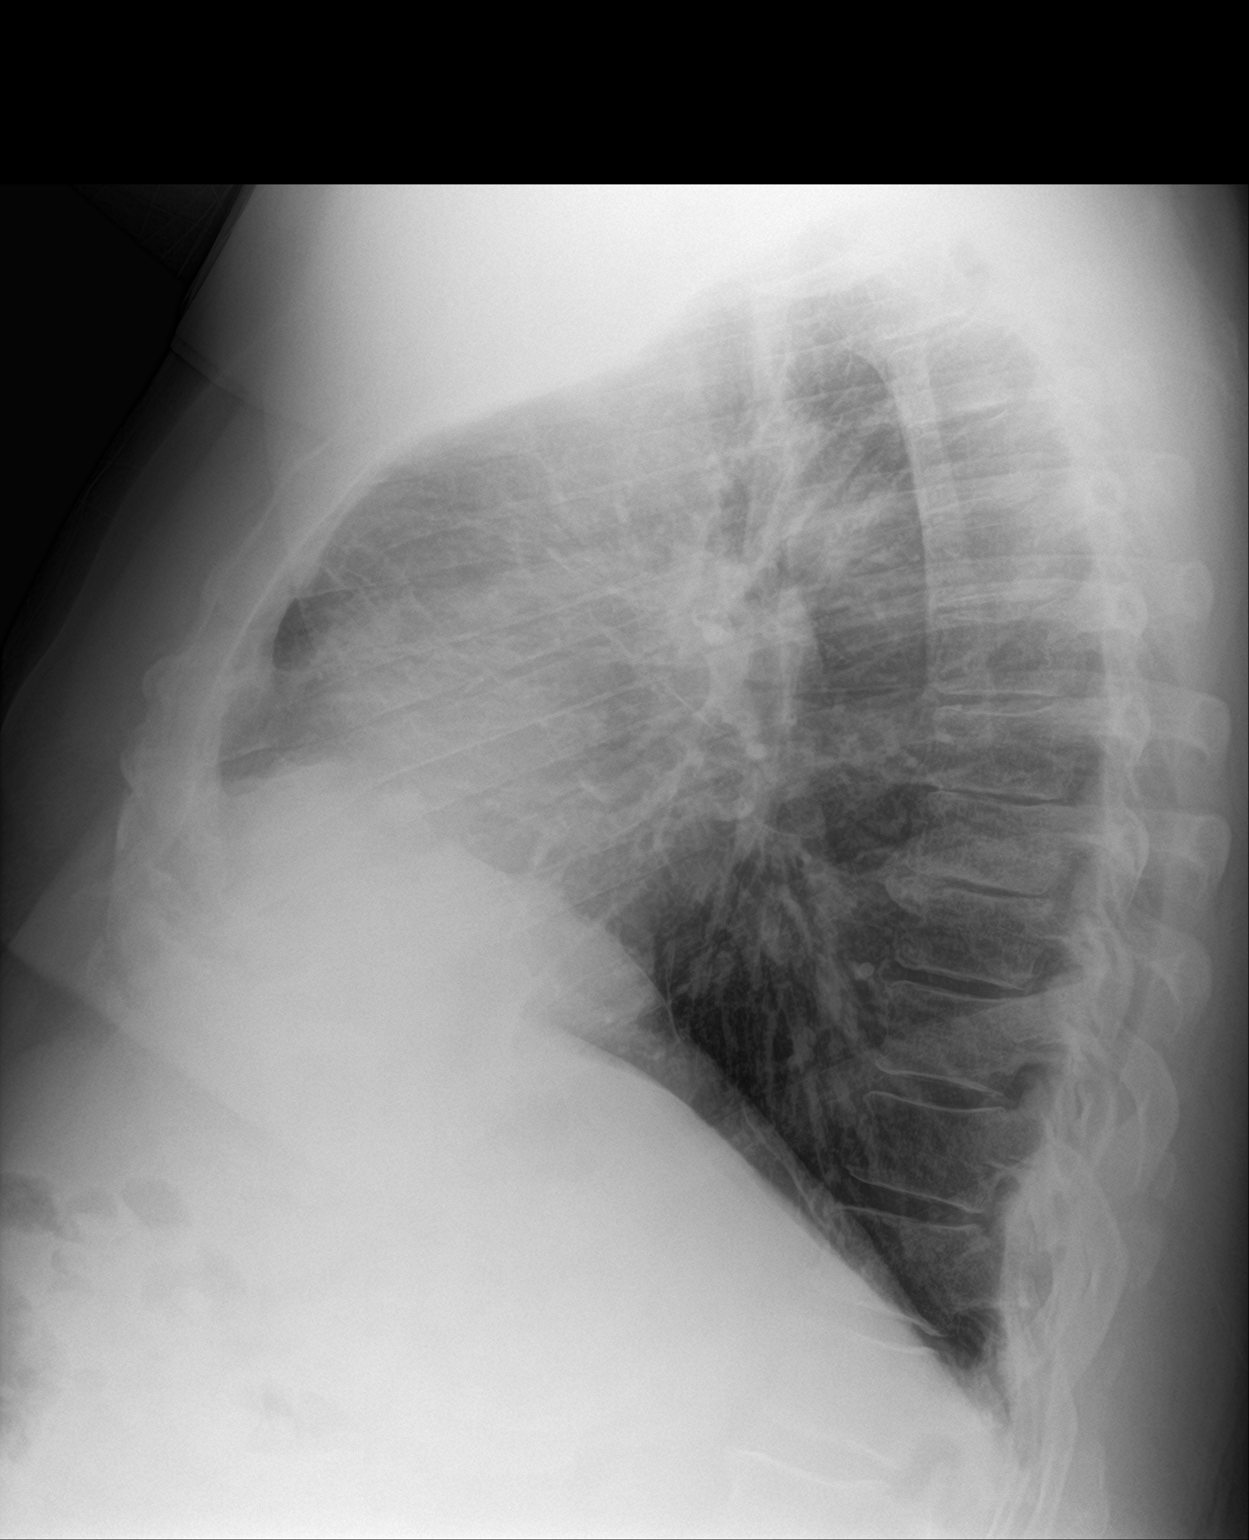

[2 of 2 positions shown; findings below may reference images not displayed]

FINDINGS: The lungs are adequately inflated. There is no focal infiltrate.
There is no pleural effusion. The cardiac silhouette is top-normal
in size. The pulmonary vascularity is not engorged. The mediastinum
is normal in width. There is mild multilevel degenerative disc space
narrowing of the thoracic spine.
IMPRESSION: There is no pneumonia nor other acute cardiopulmonary abnormality.

## 2018-11-20 ENCOUNTER — Ambulatory Visit: Payer: Self-pay | Admitting: Surgery

## 2018-12-03 ENCOUNTER — Encounter (HOSPITAL_BASED_OUTPATIENT_CLINIC_OR_DEPARTMENT_OTHER): Payer: Self-pay

## 2018-12-03 ENCOUNTER — Ambulatory Visit (HOSPITAL_BASED_OUTPATIENT_CLINIC_OR_DEPARTMENT_OTHER): Admit: 2018-12-03 | Payer: PPO | Admitting: Surgery

## 2018-12-03 SURGERY — EXCISION, KELOID
Anesthesia: General

## 2018-12-10 ENCOUNTER — Encounter (HOSPITAL_COMMUNITY): Payer: Self-pay | Admitting: Anesthesiology

## 2018-12-10 NOTE — Progress Notes (Signed)
Pt's past medical hx reviewed with Dr Royce Macadamia. Had stress test in 2017 and saw Dr Acie Fredrickson at that time. Dr Royce Macadamia would like pt to be seen by cardiology prior to the surgery to obtain clearance.. I called and notified Jessica surgery scheduler at Dr Cornett's office.

## 2018-12-12 ENCOUNTER — Other Ambulatory Visit (HOSPITAL_COMMUNITY): Admission: RE | Admit: 2018-12-12 | Payer: PPO | Source: Ambulatory Visit

## 2018-12-16 ENCOUNTER — Encounter (HOSPITAL_BASED_OUTPATIENT_CLINIC_OR_DEPARTMENT_OTHER): Payer: Self-pay

## 2018-12-16 ENCOUNTER — Ambulatory Visit (HOSPITAL_BASED_OUTPATIENT_CLINIC_OR_DEPARTMENT_OTHER): Admit: 2018-12-16 | Payer: PPO | Admitting: Surgery

## 2018-12-16 SURGERY — CYST REMOVAL
Anesthesia: General

## 2020-08-20 DIAGNOSIS — Z20822 Contact with and (suspected) exposure to covid-19: Secondary | ICD-10-CM | POA: Diagnosis not present

## 2020-09-29 DIAGNOSIS — G4733 Obstructive sleep apnea (adult) (pediatric): Secondary | ICD-10-CM | POA: Diagnosis not present

## 2020-10-15 DIAGNOSIS — M7989 Other specified soft tissue disorders: Secondary | ICD-10-CM | POA: Diagnosis not present

## 2020-10-15 DIAGNOSIS — S62631A Displaced fracture of distal phalanx of left index finger, initial encounter for closed fracture: Secondary | ICD-10-CM | POA: Diagnosis not present

## 2020-10-15 DIAGNOSIS — S62602B Fracture of unspecified phalanx of right middle finger, initial encounter for open fracture: Secondary | ICD-10-CM | POA: Diagnosis not present

## 2020-10-15 DIAGNOSIS — S62632A Displaced fracture of distal phalanx of right middle finger, initial encounter for closed fracture: Secondary | ICD-10-CM | POA: Diagnosis not present

## 2020-10-15 DIAGNOSIS — Z23 Encounter for immunization: Secondary | ICD-10-CM | POA: Diagnosis not present

## 2020-10-15 DIAGNOSIS — S61212A Laceration without foreign body of right middle finger without damage to nail, initial encounter: Secondary | ICD-10-CM | POA: Diagnosis not present

## 2020-10-17 DIAGNOSIS — M79644 Pain in right finger(s): Secondary | ICD-10-CM | POA: Diagnosis not present

## 2020-10-24 DIAGNOSIS — M79644 Pain in right finger(s): Secondary | ICD-10-CM | POA: Diagnosis not present

## 2020-10-31 DIAGNOSIS — M79644 Pain in right finger(s): Secondary | ICD-10-CM | POA: Diagnosis not present

## 2020-11-15 DIAGNOSIS — S62632A Displaced fracture of distal phalanx of right middle finger, initial encounter for closed fracture: Secondary | ICD-10-CM | POA: Diagnosis not present

## 2020-12-13 DIAGNOSIS — S62632B Displaced fracture of distal phalanx of right middle finger, initial encounter for open fracture: Secondary | ICD-10-CM | POA: Diagnosis not present

## 2020-12-20 DIAGNOSIS — H40013 Open angle with borderline findings, low risk, bilateral: Secondary | ICD-10-CM | POA: Diagnosis not present

## 2020-12-20 DIAGNOSIS — E119 Type 2 diabetes mellitus without complications: Secondary | ICD-10-CM | POA: Diagnosis not present

## 2020-12-20 DIAGNOSIS — H353131 Nonexudative age-related macular degeneration, bilateral, early dry stage: Secondary | ICD-10-CM | POA: Diagnosis not present

## 2020-12-20 DIAGNOSIS — H25813 Combined forms of age-related cataract, bilateral: Secondary | ICD-10-CM | POA: Diagnosis not present

## 2020-12-20 DIAGNOSIS — H5213 Myopia, bilateral: Secondary | ICD-10-CM | POA: Diagnosis not present

## 2020-12-20 DIAGNOSIS — H40023 Open angle with borderline findings, high risk, bilateral: Secondary | ICD-10-CM | POA: Diagnosis not present

## 2020-12-22 DIAGNOSIS — E7800 Pure hypercholesterolemia, unspecified: Secondary | ICD-10-CM | POA: Diagnosis not present

## 2020-12-22 DIAGNOSIS — E1169 Type 2 diabetes mellitus with other specified complication: Secondary | ICD-10-CM | POA: Diagnosis not present

## 2020-12-22 DIAGNOSIS — I10 Essential (primary) hypertension: Secondary | ICD-10-CM | POA: Diagnosis not present

## 2020-12-28 DIAGNOSIS — G4733 Obstructive sleep apnea (adult) (pediatric): Secondary | ICD-10-CM | POA: Diagnosis not present

## 2021-01-24 DIAGNOSIS — S62632B Displaced fracture of distal phalanx of right middle finger, initial encounter for open fracture: Secondary | ICD-10-CM | POA: Diagnosis not present

## 2021-03-07 DIAGNOSIS — S62632B Displaced fracture of distal phalanx of right middle finger, initial encounter for open fracture: Secondary | ICD-10-CM | POA: Diagnosis not present

## 2021-06-22 DIAGNOSIS — S62632B Displaced fracture of distal phalanx of right middle finger, initial encounter for open fracture: Secondary | ICD-10-CM | POA: Diagnosis not present

## 2021-06-26 DIAGNOSIS — G4733 Obstructive sleep apnea (adult) (pediatric): Secondary | ICD-10-CM | POA: Diagnosis not present

## 2021-07-11 DIAGNOSIS — K219 Gastro-esophageal reflux disease without esophagitis: Secondary | ICD-10-CM | POA: Diagnosis not present

## 2021-07-11 DIAGNOSIS — I10 Essential (primary) hypertension: Secondary | ICD-10-CM | POA: Diagnosis not present

## 2021-07-11 DIAGNOSIS — G4733 Obstructive sleep apnea (adult) (pediatric): Secondary | ICD-10-CM | POA: Diagnosis not present

## 2021-07-11 DIAGNOSIS — Z7984 Long term (current) use of oral hypoglycemic drugs: Secondary | ICD-10-CM | POA: Diagnosis not present

## 2021-07-11 DIAGNOSIS — E7800 Pure hypercholesterolemia, unspecified: Secondary | ICD-10-CM | POA: Diagnosis not present

## 2021-07-11 DIAGNOSIS — Z23 Encounter for immunization: Secondary | ICD-10-CM | POA: Diagnosis not present

## 2021-07-11 DIAGNOSIS — M4800 Spinal stenosis, site unspecified: Secondary | ICD-10-CM | POA: Diagnosis not present

## 2021-07-11 DIAGNOSIS — Z0000 Encounter for general adult medical examination without abnormal findings: Secondary | ICD-10-CM | POA: Diagnosis not present

## 2021-07-11 DIAGNOSIS — H8110 Benign paroxysmal vertigo, unspecified ear: Secondary | ICD-10-CM | POA: Diagnosis not present

## 2021-07-11 DIAGNOSIS — K580 Irritable bowel syndrome with diarrhea: Secondary | ICD-10-CM | POA: Diagnosis not present

## 2021-07-11 DIAGNOSIS — E1169 Type 2 diabetes mellitus with other specified complication: Secondary | ICD-10-CM | POA: Diagnosis not present

## 2021-10-16 DIAGNOSIS — H0102B Squamous blepharitis left eye, upper and lower eyelids: Secondary | ICD-10-CM | POA: Diagnosis not present

## 2021-10-16 DIAGNOSIS — H1045 Other chronic allergic conjunctivitis: Secondary | ICD-10-CM | POA: Diagnosis not present

## 2021-10-16 DIAGNOSIS — H16223 Keratoconjunctivitis sicca, not specified as Sjogren's, bilateral: Secondary | ICD-10-CM | POA: Diagnosis not present

## 2021-10-16 DIAGNOSIS — H0102A Squamous blepharitis right eye, upper and lower eyelids: Secondary | ICD-10-CM | POA: Diagnosis not present

## 2021-10-17 DIAGNOSIS — G4733 Obstructive sleep apnea (adult) (pediatric): Secondary | ICD-10-CM | POA: Diagnosis not present

## 2021-10-25 ENCOUNTER — Ambulatory Visit
Admission: RE | Admit: 2021-10-25 | Discharge: 2021-10-25 | Disposition: A | Discharge status: Home / Self Care | Payer: Medicare Other | Source: Ambulatory Visit | Attending: Internal Medicine | Admitting: Internal Medicine

## 2021-10-25 VITALS — BP 146/74 | HR 62 | Temp 98.1°F | Resp 16

## 2021-10-25 DIAGNOSIS — J029 Acute pharyngitis, unspecified: Secondary | ICD-10-CM | POA: Insufficient documentation

## 2021-10-25 DIAGNOSIS — R590 Localized enlarged lymph nodes: Secondary | ICD-10-CM | POA: Diagnosis not present

## 2021-10-25 DIAGNOSIS — J069 Acute upper respiratory infection, unspecified: Secondary | ICD-10-CM | POA: Insufficient documentation

## 2021-10-25 LAB — POCT RAPID STREP A (OFFICE): Rapid Strep A Screen: NEGATIVE

## 2021-10-25 MED ORDER — AMOXICILLIN 875 MG PO TABS: 875.0000 mg | ORAL_TABLET | Freq: Two times a day (BID) | ORAL | 0 refills | Status: AC

## 2021-10-25 NOTE — ED Provider Notes (Signed)
?[facility] ? ? ? ?CSN: [id number] ?Arrival date & time: 10/25/21  1250 ? ? ?  ? ?History   ?Chief Complaint ?Chief Complaint  ?Patient presents with  ? Sore Throat  ?  Appt  ? ? ?HPI ?Terry Wolfe is a 61 y.o. male.  ? ?Patient presents with sore throat, nasal congestion, right lymph node swelling to the neck that has been present for approximately 5 days.  Patient is taking cough drops and Benadryl with minimal improvement.  Patient reports that he takes cetirizine and Flonase daily that has not provided any relief either.  Denies any known fevers or sick contacts.  Denies chest pain, shortness of breath, ear pain, nausea, vomiting, diarrhea, abdominal pain. ? ? ?Sore Throat ? ? ?Past Medical History:  ?Diagnosis Date  ? Abscess   ? left thigh  ? Angina   ? denied 08/02/13  ? Arthritis   ? "right knuckles"  ? Bronchitis   ? Chronic bronchitis   ? "as a child"  ? Complication of anesthesia   ? Constipation   ? Diabetes mellitus without complication ([place])   ? Diarrhea   ? ED (erectile dysfunction)   ? Enlarged prostate with lower urinary tract symptoms (LUTS)   ? "that's what gave me epididymitis"  ? Epididymitis, right ~ 2009  ? GERD (gastroesophageal reflux disease)   ? H/O hiatal hernia   ? History of MRSA infection 03/2011; 11/15/11  ? left thigh; nasal swab  ? Hyperlipidemia   ? Hypertension   ? Obesity   ? Pneumonia   ? "chronic as a child  ? PONV (postoperative nausea and vomiting)   ? Shortness of breath dyspnea   ? Sleep apnea   ? diagnosed thru sleep study 06/2013, CPAP ordered  ? ? ?Patient Active Problem List  ? Diagnosis Date Noted  ? Chest pain 11/30/2015  ? Primary osteoarthritis of right knee 02/21/2015  ? Sleep apnea 02/21/2015  ? Severe obesity (BMI >= 40) ([place]) 02/21/2015  ? Spinal stenosis 08/13/2013  ? Thrombosed external hemorrhoid-left  01/03/2012  ? Felon, left 11/15/2011  ? Abscess of thigh 04/04/2011  ? ? ?Past Surgical History:  ?Procedure Laterality Date  ? ANTERIOR FUSION  CERVICAL SPINE  04/14/2013  ? Dr. Lynann Bologna  ? APPENDECTOMY  1977  ? COLONOSCOPY    ? DECOMPRESSIVE LUMBAR LAMINECTOMY LEVEL 2 N/A 08/13/2013  ? Procedure: DECOMPRESSIVE LUMBAR LAMINECTOMY LEVEL 2;  Surgeon: Sinclair Ship, MD;  Location: [place];  Service: Orthopedics;  Laterality: N/A;  Lumbar 4-5, lumbar 5-sacrum 1 decompression  ? FINGER FRACTURE SURGERY  09/28/2001  ? left middle finger  ? HERNIA REPAIR  2005  ? I & D EXTREMITY  11/15/2011  ? Procedure: IRRIGATION AND DEBRIDEMENT EXTREMITY;  Surgeon: Mcarthur Rossetti, MD;  Location: [place];  Service: Orthopedics;  Laterality: Left;  Irrigation and debridement left thumb  ? INCISE AND DRAIN ABCESS  03/2011  ? left thigh  ? INCISION AND DRAINAGE OF WOUND  1990's; 11/15/11  ? right; left  ? PILONIDAL CYST EXCISION  1980s  ? removed from lower back  ? STAPLE HEMORRHOIDECTOMY  09/07/2008  ? [place] Dr Zettie Pho  ? TOTAL KNEE ARTHROPLASTY Right 02/21/2015  ? Procedure: RIGHT TOTAL KNEE ARTHROPLASTY;  Surgeon: Dorna Leitz, MD;  Location: [place];  Service: Orthopedics;  Laterality: Right;  ? TRACHEOSTOMY  1972  ? trach for 4 days as child for accident  ? UMBILICAL HERNIA REPAIR  01/26/2004  ? Ventralex -  Dr Rebekah [place]field  ? ? ? ? ? ?Home Medications   ? ?Prior to Admission medications   ?Medication Sig Start Date End Date Taking? Authorizing Provider  ?amoxicillin (AMOXIL) 875 MG tablet Take 1 tablet (875 mg total) by mouth 2 (two) times daily for 10 days. 10/25/21 11/04/21 Yes Teodora Medici, FNP  ?aspirin EC 325 MG tablet Take 1 tablet (325 mg total) by mouth 2 (two) times daily after a meal. Take x 1 month post op to decrease risk of blood clots. 02/21/15   Gary Fleet, PA-C  ?benzonatate (TESSALON) 100 MG capsule Take 1 capsule (100 mg total) by mouth every 8 (eight) hours. 08/12/17   Tasia Catchings, Amy V, PA-C  ?cetirizine (ZYRTEC) 10 MG tablet Take 10 mg by mouth daily.    [provider]  ?cetirizine-pseudoephedrine (ZYRTEC-D) 5-120 MG tablet Take 1 tablet by mouth daily. 08/12/17    Tasia Catchings, Amy V, PA-C  ?chlorpheniramine-HYDROcodone (TUSSIONEX PENNKINETIC ER) 10-8 MG/5ML SUER Take 5 mLs by mouth at bedtime as needed for cough. 08/12/17   Tasia Catchings, Amy V, PA-C  ?cloNIDine (CATAPRES) 0.1 MG tablet Take 0.1 mg by mouth 2 (two) times daily. 07/06/15   [provider]  ?cyclobenzaprine (FLEXERIL) 5 MG tablet Take 1 tablet (5 mg total) by mouth at bedtime. 06/21/17   Robyn Haber, MD  ?Dulaglutide (TRULICITY) 1.5 OB/0.9GG SOPN Inject 1.5 mg into the skin once a week. On Saturday    [provider]  ?fluticasone (FLONASE) 50 MCG/ACT nasal spray Place 2 sprays into both nostrils daily. 08/12/17   Ok Edwards, PA-C  ?Glucos-Chondroit-Collag-Hyal (GLUCOSAMINE CHONDROIT-COLLAGEN) CAPS Take 1,000 mg by mouth daily.    [provider]  ?hydrocortisone 2.5 % cream Apply topically 2 (two) times daily as needed (itching). 07/01/14   Street, [place], PA-C  ?hydrOXYzine (ATARAX/VISTARIL) 25 MG tablet Take 1 tablet (25 mg total) by mouth every 6 (six) hours as needed for itching. ?Patient taking differently: Take 25 mg by mouth at bedtime as needed for itching (sleep).  07/01/14   Street, [place], PA-C  ?meclizine (ANTIVERT) 25 MG tablet Take 1 tablet by mouth at bedtime. 10/31/15   [provider]  ?meloxicam (MOBIC) 7.5 MG tablet Take 1 tablet (7.5 mg total) by mouth daily. 06/21/17   Robyn Haber, MD  ?Misc Natural Products (GLUCOSAMINE CHONDROITIN TRIPLE) TABS Take 1 tablet by mouth daily.    [provider]  ?mometasone (NASONEX) 50 MCG/ACT nasal spray Place 1 spray into both nostrils at bedtime.    [provider]  ?Multiple Vitamin (MULITIVITAMIN WITH MINERALS) TABS Take 1 tablet by mouth daily.    [provider]  ?nebivolol (BYSTOLIC) 10 MG tablet Take 20 mg by mouth daily.    [provider]  ?Olmesartan-Amlodipine-HCTZ (TRIBENZOR) 40-10-25 MG TABS Take 1 tablet by mouth daily.    [provider]  ?OXYGEN as directed. cpap 12/09/13    [provider]  ?pantoprazole (PROTONIX) 40 MG tablet Take 40 mg by mouth daily. 10/31/15   [provider]  ?rosuvastatin (CRESTOR) 10 MG tablet Take 10 mg by mouth daily.    [provider]  ?sildenafil (VIAGRA) 50 MG tablet Take 50 mg by mouth as needed. For erectile dysfunction    [provider]  ?tadalafil (CIALIS) 5 MG tablet Take 5 mg by mouth daily.     [provider]  ?Tamsulosin HCl (FLOMAX) 0.4 MG CAPS Take 0.4 mg by mouth daily after breakfast. For urges to urinate    [provider]  ?Vitamins-Lipotropics (LIPO-FLAVONOID PLUS) TABS Take 1 tablet by mouth at bedtime as needed (ringing in ears).    [provider]  ?zolpidem (AMBIEN) 10 MG tablet Take 10 mg by mouth at bedtime.    [provider]  ? ? ?Family History ?Family History  ?Problem Relation Age of Onset  ? Asthma Other   ? Diabetes Other   ? Hypertension Other   ? Obesity Other   ? Sleep apnea Other   ? ? ?Social History ?Social History  ? ?Tobacco Use  ? Smoking status: Never  ? Smokeless tobacco: Never  ?Substance Use Topics  ? Alcohol use: Yes  ?  Comment: social  ? Drug use: No  ? ? ? ?Allergies   ?Etodolac ? ? ?Review of Systems ?Review of Systems ?Per HPI ? ?Physical Exam ?Triage Vital Signs ?ED Triage Vitals  ?Enc Vitals Group  ?   BP 10/25/21 1309 (!) 177/77  ?   Pulse Rate 10/25/21 1309 62  ?   Resp 10/25/21 1309 16  ?   Temp 10/25/21 1309 98.1 ?F (36.7 ?C)  ?   Temp Source 10/25/21 1309 Oral  ?   SpO2 10/25/21 1309 94 %  ?   Weight --   ?   Height --   ?   Head Circumference --   ?   Peak Flow --   ?   Pain Score 10/25/21 1308 4  ?   Pain Loc --   ?   Pain Edu? --   ?   Excl. in [place]? --   ? ?No data found. ? ?Updated Vital Signs ?BP (!) 146/74   Pulse 62   Temp 98.1 ?F (36.7 ?C) (Oral)   Resp 16   SpO2 94%  ? ?Visual Acuity ?Right Eye Distance:   ?Left Eye Distance:   ?Bilateral Distance:   ? ?Right Eye Near:   ?Left Eye Near:    ?Bilateral Near:     ? ?Physical Exam ?Constitutional:   ?   General: He is not in acute distress. ?   Appearance: Normal appearance. He is not toxic-appearing or diaphoretic.  ?HENT:  ?   Head: Normocephalic and atraumatic.  ?   Right Ear

## 2021-10-25 NOTE — ED Triage Notes (Signed)
Pt presents with c/o sore throat x 5 days. Pt states he has taken cough drops and benadryl. ?

## 2021-10-25 NOTE — Discharge Instructions (Signed)
Your rapid strep was negative.  Throat culture and COVID test are pending.  We will call if they are positive.  You have been prescribed antibiotic to help alleviate symptoms.  Please follow-up with primary care doctor if symptoms persist or worsen. ?

## 2021-10-26 LAB — NOVEL CORONAVIRUS, NAA: SARS-CoV-2, NAA: NOT DETECTED

## 2021-10-27 LAB — CULTURE, GROUP A STREP (THRC)

## 2021-12-21 DIAGNOSIS — H0102B Squamous blepharitis left eye, upper and lower eyelids: Secondary | ICD-10-CM | POA: Diagnosis not present

## 2021-12-21 DIAGNOSIS — H40023 Open angle with borderline findings, high risk, bilateral: Secondary | ICD-10-CM | POA: Diagnosis not present

## 2021-12-21 DIAGNOSIS — H18453 Nodular corneal degeneration, bilateral: Secondary | ICD-10-CM | POA: Diagnosis not present

## 2021-12-21 DIAGNOSIS — H40013 Open angle with borderline findings, low risk, bilateral: Secondary | ICD-10-CM | POA: Diagnosis not present

## 2021-12-21 DIAGNOSIS — H25813 Combined forms of age-related cataract, bilateral: Secondary | ICD-10-CM | POA: Diagnosis not present

## 2021-12-21 DIAGNOSIS — E119 Type 2 diabetes mellitus without complications: Secondary | ICD-10-CM | POA: Diagnosis not present

## 2021-12-21 DIAGNOSIS — H1045 Other chronic allergic conjunctivitis: Secondary | ICD-10-CM | POA: Diagnosis not present

## 2021-12-21 DIAGNOSIS — H52223 Regular astigmatism, bilateral: Secondary | ICD-10-CM | POA: Diagnosis not present

## 2021-12-21 DIAGNOSIS — H524 Presbyopia: Secondary | ICD-10-CM | POA: Diagnosis not present

## 2021-12-21 DIAGNOSIS — H5213 Myopia, bilateral: Secondary | ICD-10-CM | POA: Diagnosis not present

## 2021-12-21 DIAGNOSIS — H0102A Squamous blepharitis right eye, upper and lower eyelids: Secondary | ICD-10-CM | POA: Diagnosis not present

## 2022-01-08 DIAGNOSIS — E1169 Type 2 diabetes mellitus with other specified complication: Secondary | ICD-10-CM | POA: Diagnosis not present

## 2022-01-08 DIAGNOSIS — E7800 Pure hypercholesterolemia, unspecified: Secondary | ICD-10-CM | POA: Diagnosis not present

## 2022-01-08 DIAGNOSIS — I10 Essential (primary) hypertension: Secondary | ICD-10-CM | POA: Diagnosis not present

## 2022-01-08 DIAGNOSIS — Z23 Encounter for immunization: Secondary | ICD-10-CM | POA: Diagnosis not present

## 2022-01-31 DIAGNOSIS — G4733 Obstructive sleep apnea (adult) (pediatric): Secondary | ICD-10-CM | POA: Diagnosis not present

## 2022-02-05 DIAGNOSIS — H527 Unspecified disorder of refraction: Secondary | ICD-10-CM | POA: Diagnosis not present

## 2022-05-14 DIAGNOSIS — G4733 Obstructive sleep apnea (adult) (pediatric): Secondary | ICD-10-CM | POA: Diagnosis not present

## 2022-07-18 DIAGNOSIS — I10 Essential (primary) hypertension: Secondary | ICD-10-CM | POA: Diagnosis not present

## 2022-07-18 DIAGNOSIS — M4800 Spinal stenosis, site unspecified: Secondary | ICD-10-CM | POA: Diagnosis not present

## 2022-07-18 DIAGNOSIS — Z0000 Encounter for general adult medical examination without abnormal findings: Secondary | ICD-10-CM | POA: Diagnosis not present

## 2022-07-18 DIAGNOSIS — G4733 Obstructive sleep apnea (adult) (pediatric): Secondary | ICD-10-CM | POA: Diagnosis not present

## 2022-07-18 DIAGNOSIS — K580 Irritable bowel syndrome with diarrhea: Secondary | ICD-10-CM | POA: Diagnosis not present

## 2022-07-18 DIAGNOSIS — K219 Gastro-esophageal reflux disease without esophagitis: Secondary | ICD-10-CM | POA: Diagnosis not present

## 2022-07-18 DIAGNOSIS — J019 Acute sinusitis, unspecified: Secondary | ICD-10-CM | POA: Diagnosis not present

## 2022-07-18 DIAGNOSIS — E7800 Pure hypercholesterolemia, unspecified: Secondary | ICD-10-CM | POA: Diagnosis not present

## 2022-07-18 DIAGNOSIS — E1169 Type 2 diabetes mellitus with other specified complication: Secondary | ICD-10-CM | POA: Diagnosis not present

## 2022-08-27 DIAGNOSIS — G4733 Obstructive sleep apnea (adult) (pediatric): Secondary | ICD-10-CM | POA: Diagnosis not present

## 2022-12-03 DIAGNOSIS — G4733 Obstructive sleep apnea (adult) (pediatric): Secondary | ICD-10-CM | POA: Diagnosis not present

## 2022-12-27 DIAGNOSIS — H25813 Combined forms of age-related cataract, bilateral: Secondary | ICD-10-CM | POA: Diagnosis not present

## 2022-12-27 DIAGNOSIS — H40013 Open angle with borderline findings, low risk, bilateral: Secondary | ICD-10-CM | POA: Diagnosis not present

## 2022-12-27 DIAGNOSIS — H1045 Other chronic allergic conjunctivitis: Secondary | ICD-10-CM | POA: Diagnosis not present

## 2022-12-27 DIAGNOSIS — H18453 Nodular corneal degeneration, bilateral: Secondary | ICD-10-CM | POA: Diagnosis not present

## 2022-12-27 DIAGNOSIS — H0102A Squamous blepharitis right eye, upper and lower eyelids: Secondary | ICD-10-CM | POA: Diagnosis not present

## 2022-12-27 DIAGNOSIS — H0102B Squamous blepharitis left eye, upper and lower eyelids: Secondary | ICD-10-CM | POA: Diagnosis not present

## 2022-12-27 DIAGNOSIS — E119 Type 2 diabetes mellitus without complications: Secondary | ICD-10-CM | POA: Diagnosis not present

## 2023-01-16 DIAGNOSIS — E7800 Pure hypercholesterolemia, unspecified: Secondary | ICD-10-CM | POA: Diagnosis not present

## 2023-01-16 DIAGNOSIS — I10 Essential (primary) hypertension: Secondary | ICD-10-CM | POA: Diagnosis not present

## 2023-01-16 DIAGNOSIS — E1169 Type 2 diabetes mellitus with other specified complication: Secondary | ICD-10-CM | POA: Diagnosis not present

## 2023-01-16 DIAGNOSIS — E119 Type 2 diabetes mellitus without complications: Secondary | ICD-10-CM | POA: Diagnosis not present

## 2023-07-22 ENCOUNTER — Other Ambulatory Visit: Payer: Self-pay | Admitting: Family Medicine

## 2023-07-22 ENCOUNTER — Ambulatory Visit
Admission: RE | Admit: 2023-07-22 | Discharge: 2023-07-22 | Disposition: A | Discharge status: Home / Self Care | Payer: Medicare Other | Source: Ambulatory Visit | Attending: Family Medicine | Admitting: Family Medicine

## 2023-07-22 DIAGNOSIS — Z0000 Encounter for general adult medical examination without abnormal findings: Secondary | ICD-10-CM | POA: Diagnosis not present

## 2023-07-22 DIAGNOSIS — R059 Cough, unspecified: Secondary | ICD-10-CM | POA: Diagnosis not present

## 2023-07-22 DIAGNOSIS — E1169 Type 2 diabetes mellitus with other specified complication: Secondary | ICD-10-CM | POA: Diagnosis not present

## 2023-07-22 DIAGNOSIS — I10 Essential (primary) hypertension: Secondary | ICD-10-CM | POA: Diagnosis not present

## 2023-07-22 DIAGNOSIS — M25569 Pain in unspecified knee: Secondary | ICD-10-CM | POA: Diagnosis not present

## 2023-07-22 DIAGNOSIS — R0602 Shortness of breath: Secondary | ICD-10-CM | POA: Diagnosis not present

## 2023-07-22 DIAGNOSIS — R079 Chest pain, unspecified: Secondary | ICD-10-CM | POA: Diagnosis not present

## 2023-07-22 DIAGNOSIS — K580 Irritable bowel syndrome with diarrhea: Secondary | ICD-10-CM | POA: Diagnosis not present

## 2023-07-22 DIAGNOSIS — E7800 Pure hypercholesterolemia, unspecified: Secondary | ICD-10-CM | POA: Diagnosis not present

## 2023-07-22 DIAGNOSIS — G4733 Obstructive sleep apnea (adult) (pediatric): Secondary | ICD-10-CM | POA: Diagnosis not present

## 2023-07-22 DIAGNOSIS — K219 Gastro-esophageal reflux disease without esophagitis: Secondary | ICD-10-CM | POA: Diagnosis not present

## 2023-07-22 DIAGNOSIS — Z23 Encounter for immunization: Secondary | ICD-10-CM | POA: Diagnosis not present

## 2023-07-22 DIAGNOSIS — M4800 Spinal stenosis, site unspecified: Secondary | ICD-10-CM | POA: Diagnosis not present

## 2023-08-30 DIAGNOSIS — D485 Neoplasm of uncertain behavior of skin: Secondary | ICD-10-CM | POA: Diagnosis not present

## 2023-09-03 DIAGNOSIS — C44329 Squamous cell carcinoma of skin of other parts of face: Secondary | ICD-10-CM | POA: Diagnosis not present

## 2023-09-03 DIAGNOSIS — D485 Neoplasm of uncertain behavior of skin: Secondary | ICD-10-CM | POA: Diagnosis not present

## 2023-09-17 DIAGNOSIS — C44329 Squamous cell carcinoma of skin of other parts of face: Secondary | ICD-10-CM | POA: Diagnosis not present

## 2023-10-23 DIAGNOSIS — Z7189 Other specified counseling: Secondary | ICD-10-CM | POA: Diagnosis not present

## 2023-10-23 DIAGNOSIS — L814 Other melanin hyperpigmentation: Secondary | ICD-10-CM | POA: Diagnosis not present

## 2023-10-23 DIAGNOSIS — L821 Other seborrheic keratosis: Secondary | ICD-10-CM | POA: Diagnosis not present

## 2023-10-23 DIAGNOSIS — L728 Other follicular cysts of the skin and subcutaneous tissue: Secondary | ICD-10-CM | POA: Diagnosis not present

## 2023-10-23 DIAGNOSIS — D485 Neoplasm of uncertain behavior of skin: Secondary | ICD-10-CM | POA: Diagnosis not present

## 2023-10-23 DIAGNOSIS — Z85828 Personal history of other malignant neoplasm of skin: Secondary | ICD-10-CM | POA: Diagnosis not present

## 2023-10-23 DIAGNOSIS — C44712 Basal cell carcinoma of skin of right lower limb, including hip: Secondary | ICD-10-CM | POA: Diagnosis not present

## 2023-10-23 DIAGNOSIS — Z08 Encounter for follow-up examination after completed treatment for malignant neoplasm: Secondary | ICD-10-CM | POA: Diagnosis not present

## 2023-10-23 DIAGNOSIS — D225 Melanocytic nevi of trunk: Secondary | ICD-10-CM | POA: Diagnosis not present

## 2023-10-28 DIAGNOSIS — L538 Other specified erythematous conditions: Secondary | ICD-10-CM | POA: Diagnosis not present

## 2023-10-28 DIAGNOSIS — R208 Other disturbances of skin sensation: Secondary | ICD-10-CM | POA: Diagnosis not present

## 2023-10-28 DIAGNOSIS — D485 Neoplasm of uncertain behavior of skin: Secondary | ICD-10-CM | POA: Diagnosis not present

## 2023-10-28 DIAGNOSIS — Z789 Other specified health status: Secondary | ICD-10-CM | POA: Diagnosis not present

## 2023-10-28 DIAGNOSIS — L905 Scar conditions and fibrosis of skin: Secondary | ICD-10-CM | POA: Diagnosis not present

## 2023-11-11 DIAGNOSIS — C44712 Basal cell carcinoma of skin of right lower limb, including hip: Secondary | ICD-10-CM | POA: Diagnosis not present

## 2023-11-19 DIAGNOSIS — Z48817 Encounter for surgical aftercare following surgery on the skin and subcutaneous tissue: Secondary | ICD-10-CM | POA: Diagnosis not present

## 2024-01-09 DIAGNOSIS — H18453 Nodular corneal degeneration, bilateral: Secondary | ICD-10-CM | POA: Diagnosis not present

## 2024-01-09 DIAGNOSIS — E119 Type 2 diabetes mellitus without complications: Secondary | ICD-10-CM | POA: Diagnosis not present

## 2024-01-09 DIAGNOSIS — H40013 Open angle with borderline findings, low risk, bilateral: Secondary | ICD-10-CM | POA: Diagnosis not present

## 2024-01-09 DIAGNOSIS — H0102B Squamous blepharitis left eye, upper and lower eyelids: Secondary | ICD-10-CM | POA: Diagnosis not present

## 2024-01-09 DIAGNOSIS — H1045 Other chronic allergic conjunctivitis: Secondary | ICD-10-CM | POA: Diagnosis not present

## 2024-01-09 DIAGNOSIS — H0102A Squamous blepharitis right eye, upper and lower eyelids: Secondary | ICD-10-CM | POA: Diagnosis not present

## 2024-01-09 DIAGNOSIS — H25813 Combined forms of age-related cataract, bilateral: Secondary | ICD-10-CM | POA: Diagnosis not present

## 2024-01-13 DIAGNOSIS — E1169 Type 2 diabetes mellitus with other specified complication: Secondary | ICD-10-CM | POA: Diagnosis not present

## 2024-01-13 DIAGNOSIS — E7800 Pure hypercholesterolemia, unspecified: Secondary | ICD-10-CM | POA: Diagnosis not present

## 2024-01-20 DIAGNOSIS — E1169 Type 2 diabetes mellitus with other specified complication: Secondary | ICD-10-CM | POA: Diagnosis not present

## 2024-01-20 DIAGNOSIS — E7800 Pure hypercholesterolemia, unspecified: Secondary | ICD-10-CM | POA: Diagnosis not present

## 2024-01-20 DIAGNOSIS — I10 Essential (primary) hypertension: Secondary | ICD-10-CM | POA: Diagnosis not present

## 2024-02-03 DIAGNOSIS — Z48817 Encounter for surgical aftercare following surgery on the skin and subcutaneous tissue: Secondary | ICD-10-CM | POA: Diagnosis not present

## 2024-02-13 DIAGNOSIS — E7800 Pure hypercholesterolemia, unspecified: Secondary | ICD-10-CM | POA: Diagnosis not present

## 2024-02-13 DIAGNOSIS — E1169 Type 2 diabetes mellitus with other specified complication: Secondary | ICD-10-CM | POA: Diagnosis not present

## 2024-03-15 DIAGNOSIS — E1169 Type 2 diabetes mellitus with other specified complication: Secondary | ICD-10-CM | POA: Diagnosis not present

## 2024-03-15 DIAGNOSIS — E7800 Pure hypercholesterolemia, unspecified: Secondary | ICD-10-CM | POA: Diagnosis not present

## 2024-04-14 DIAGNOSIS — E7800 Pure hypercholesterolemia, unspecified: Secondary | ICD-10-CM | POA: Diagnosis not present

## 2024-04-14 DIAGNOSIS — E1169 Type 2 diabetes mellitus with other specified complication: Secondary | ICD-10-CM | POA: Diagnosis not present

## 2024-07-06 ENCOUNTER — Ambulatory Visit (INDEPENDENT_AMBULATORY_CARE_PROVIDER_SITE_OTHER)

## 2024-07-06 ENCOUNTER — Ambulatory Visit: Admission: RE | Admit: 2024-07-06 | Discharge: 2024-07-06 | Disposition: A | Discharge status: Home / Self Care

## 2024-07-06 VITALS — BP 177/88 | HR 64 | Temp 98.6°F | Resp 18

## 2024-07-06 DIAGNOSIS — R052 Subacute cough: Secondary | ICD-10-CM

## 2024-07-06 LAB — GLUCOSE, POCT (MANUAL RESULT ENTRY): POC Glucose: 126 mg/dL — AB (ref 70–99)

## 2024-07-06 MED ORDER — AZITHROMYCIN 250 MG PO TABS: 250.0000 mg | ORAL_TABLET | Freq: Every day | ORAL | 0 refills | Status: AC

## 2024-07-06 MED ORDER — PREDNISONE 20 MG PO TABS: 40.0000 mg | ORAL_TABLET | Freq: Every day | ORAL | 0 refills | Status: AC

## 2024-07-06 NOTE — ED Triage Notes (Addendum)
 Cough and congestion, worse at night, for 1 month. He is taking dayquil, flonase , and zyrtec  at night. Reports dizziness over the 4 weeks.

## 2024-07-06 NOTE — Discharge Instructions (Addendum)
-   Your lung x-ray looks fairly normal to me, except for a small spot that I worry is turning into a bacterial infection (pneumonia) - so we will treat for this with an antibiotic and a steroid.  I will call in 1 to 2 hours if the radiologist sees something that I did not.  If you do not get a call, the x-ray was otherwise normal.   -Prednisone , 2 pills taken at the same time for 5 days in a row.  Try taking this earlier in the day as it can give you energy. Avoid NSAIDs like ibuprofen and alleve while taking this medication as they can increase your risk of stomach upset and even GI bleeding when in combination with a steroid. You can continue tylenol  (acetaminophen ) up to 1000mg  3x daily. -Azithromycin  antibiotic for 5 days -Your cough should slowly get better instead of worse. If you develop a cough productive of dark or red sputum, new shortness of breath, new chest tightness, new fevers, etc - seek additional care. -Follow-up with your PCP on 07/22/23 as scheduled

## 2024-07-06 NOTE — ED Provider Notes (Signed)
 " [facility]    CSN: [id number] Arrival date & time: 07/06/24  1345      History   Chief Complaint Chief Complaint  Patient presents with   Cough    Entered by patient    HPI Terry Wolfe is a 63 y.o. male presenting for cough.  History chronic bronchitis as a child, OSA. Cough and congestion, worse at night, for 1 month. Cough is productive of clear sputum. Notes dry mouth. He is taking dayquil, flonase , and zyrtec  at night. Endorses SOB, which is worse with exertion. Does not use home inhalers.   The patient denies a history of pulmonary disease    HPI  Past Medical History:  Diagnosis Date   Abscess    left thigh   Angina    denied 08/02/13   Arthritis    right knuckles   Bronchitis    Chronic bronchitis    as a child   Complication of anesthesia    Constipation    Diabetes mellitus without complication (HCC)    Diarrhea    ED (erectile dysfunction)    Enlarged prostate with lower urinary tract symptoms (LUTS)    that's what gave me epididymitis   Epididymitis, right ~ 2009   GERD (gastroesophageal reflux disease)    H/O hiatal hernia    History of MRSA infection 03/2011; 11/15/11   left thigh; nasal swab   Hyperlipidemia    Hypertension    Obesity    Pneumonia    chronic as a child   PONV (postoperative nausea and vomiting)    Shortness of breath dyspnea    Sleep apnea    diagnosed thru sleep study 06/2013, CPAP ordered    Patient Active Problem List   Diagnosis Date Noted   Chest pain 11/30/2015   Primary osteoarthritis of right knee 02/21/2015   Sleep apnea 02/21/2015   Severe obesity (BMI >= 40) (HCC) 02/21/2015   Spinal stenosis 08/13/2013   Thrombosed external hemorrhoid-left  01/03/2012   Felon of finger of left hand 11/15/2011   Abscess of thigh 04/04/2011    Past Surgical History:  Procedure Laterality Date   ANTERIOR FUSION CERVICAL SPINE  04/14/2013   Dr. Beuford   APPENDECTOMY  1977   COLONOSCOPY      DECOMPRESSIVE LUMBAR LAMINECTOMY LEVEL 2 N/A 08/13/2013   Procedure: DECOMPRESSIVE LUMBAR LAMINECTOMY LEVEL 2;  Surgeon: Oneil Rodgers Beuford, MD;  Location: [facility] OR;  Service: Orthopedics;  Laterality: N/A;  Lumbar 4-5, lumbar 5-sacrum 1 decompression   FINGER FRACTURE SURGERY  09/28/2001   left middle finger   HERNIA REPAIR  2005   I & D EXTREMITY  11/15/2011   Procedure: IRRIGATION AND DEBRIDEMENT EXTREMITY;  Surgeon: Lonni CINDERELLA Poli, MD;  Location: [facility] OR;  Service: Orthopedics;  Laterality: Left;  Irrigation and debridement left thumb   INCISE AND DRAIN ABCESS  03/2011   left thigh   INCISION AND DRAINAGE OF WOUND  1990's; 11/15/11   right; left   PILONIDAL CYST EXCISION  1980s   removed from lower back   STAPLE HEMORRHOIDECTOMY  09/07/2008   [facility] Dr Benedetta   TOTAL KNEE ARTHROPLASTY Right 02/21/2015   Procedure: RIGHT TOTAL KNEE ARTHROPLASTY;  Surgeon: Norleen Gavel, MD;  Location: [facility] OR;  Service: Orthopedics;  Laterality: Right;   TRACHEOSTOMY  1972   trach for 4 days as child for accident   UMBILICAL HERNIA REPAIR  01/26/2004   Ventralex - Dr Debby       Home  Medications    Prior to Admission medications  Medication Sig Start Date End Date Taking? Authorizing Provider  aspirin  EC 325 MG tablet Take 1 tablet (325 mg total) by mouth 2 (two) times daily after a meal. Take x 1 month post op to decrease risk of blood clots. 02/21/15  Yes Rondall Agent, PA-C  azithromycin  (ZITHROMAX  Z-PAK) 250 MG tablet Take 1 tablet (250 mg total) by mouth daily. Two pills (500mg ) day 1. One pill per day (250mg ) days 2-5. 07/06/24  Yes Kyron Schlitt E, PA-C  cetirizine  (ZYRTEC ) 10 MG tablet Take 10 mg by mouth daily.   Yes [provider]  fluticasone  (FLONASE ) 50 MCG/ACT nasal spray Place 2 sprays into both nostrils daily. 08/12/17  Yes Yu, Amy V, PA-C  Glucos-Chondroit-Collag-Hyal (GLUCOSAMINE CHONDROIT-COLLAGEN) CAPS Take 1,000 mg by mouth daily.   Yes [provider]  meloxicam  (MOBIC )  7.5 MG tablet Take 1 tablet (7.5 mg total) by mouth daily. 06/21/17  Yes Mario Million, MD  metFORMIN (GLUCOPHAGE-XR) 500 MG 24 hr tablet Take 500 mg by mouth 2 (two) times daily.   Yes [provider]  metoprolol succinate (TOPROL-XL) 50 MG 24 hr tablet Take 50 mg by mouth daily.   Yes [provider]  Misc Natural Products (GLUCOSAMINE CHONDROITIN TRIPLE) TABS Take 1 tablet by mouth daily.   Yes [provider]  Multiple Vitamin (MULITIVITAMIN WITH MINERALS) TABS Take 1 tablet by mouth daily.   Yes [provider]  Multiple Vitamins-Minerals (PRESERVISION AREDS 2) CAPS 1 capsule.   Yes [provider]  Olmesartan -Amlodipine -HCTZ (TRIBENZOR) 40-10-25 MG TABS Take 1 tablet by mouth daily.   Yes [provider]  Omega-3 Fatty Acids (FISH OIL) 1000 MG CAPS 1 capsule.   Yes [provider]  OXYGEN as directed. cpap 12/09/13  Yes [provider]  pantoprazole  (PROTONIX ) 40 MG tablet Take 40 mg by mouth daily. 10/31/15  Yes [provider]  predniSONE  (DELTASONE ) 20 MG tablet Take 2 tablets (40 mg total) by mouth daily for 5 days. Take with breakfast or lunch. Avoid NSAIDs (ibuprofen, etc) while taking this medication. 07/06/24 07/11/24 Yes Arlyss Leita BRAVO, PA-C  rosuvastatin  (CRESTOR ) 10 MG tablet Take 10 mg by mouth daily.   Yes [provider]  Semaglutide, 2 MG/DOSE, (OZEMPIC, 2 MG/DOSE,) 8 MG/3ML SOPN 2 mg Subcutaneous Once a week   Yes [provider]  Tamsulosin  HCl (FLOMAX ) 0.4 MG CAPS Take 0.4 mg by mouth daily after breakfast. For urges to urinate   Yes [provider]  mometasone (NASONEX) 50 MCG/ACT nasal spray Place 1 spray into both nostrils at bedtime. Patient not taking: Reported on 07/06/2024    [provider]  nebivolol  (BYSTOLIC ) 10 MG tablet Take 20 mg by mouth daily. Patient not taking: Reported on 07/06/2024    [provider]  sildenafil (VIAGRA) 50 MG  tablet Take 50 mg by mouth as needed. For erectile dysfunction Patient not taking: Reported on 07/06/2024    [provider]  tadalafil (CIALIS) 5 MG tablet Take 5 mg by mouth daily.  Patient not taking: Reported on 07/06/2024    [provider]  Vitamins-Lipotropics (LIPO-FLAVONOID PLUS) TABS Take 1 tablet by mouth at bedtime as needed (ringing in ears). Patient not taking: Reported on 07/06/2024    [provider]  zolpidem  (AMBIEN ) 10 MG tablet Take 10 mg by mouth at bedtime. Patient not taking: Reported on 07/06/2024    [provider]    Family History Family  History  Problem Relation Age of Onset   Asthma Other    Diabetes Other    Hypertension Other    Obesity Other    Sleep apnea Other     Social History Social History[1]   Allergies   Etodolac   Review of Systems Review of Systems  Constitutional:  Negative for appetite change, chills and fever.  HENT:  Positive for congestion. Negative for ear pain, rhinorrhea, sinus pressure, sinus pain and sore throat.   Eyes:  Negative for redness and visual disturbance.  Respiratory:  Positive for cough. Negative for chest tightness, shortness of breath and wheezing.   Cardiovascular:  Negative for chest pain and palpitations.  Gastrointestinal:  Negative for abdominal pain, constipation, diarrhea, nausea and vomiting.  Genitourinary:  Negative for dysuria, frequency and urgency.  Musculoskeletal:  Negative for myalgias.  Neurological:  Negative for dizziness, weakness and headaches.  Psychiatric/Behavioral:  Negative for confusion.   All other systems reviewed and are negative.    Physical Exam Triage Vital Signs ED Triage Vitals  Encounter Vitals Group     BP      Girls Systolic BP Percentile      Girls Diastolic BP Percentile      Boys Systolic BP Percentile      Boys Diastolic BP Percentile      Pulse      Resp      Temp      Temp src      SpO2      Weight      Height       Head Circumference      Peak Flow      Pain Score      Pain Loc      Pain Education      Exclude from Growth Chart    No data found.  Updated Vital Signs BP (!) 177/88 (BP Location: Right Arm)   Pulse 64   Temp 98.6 F (37 C) (Oral)   Resp 18   SpO2 95%   Visual Acuity Right Eye Distance:   Left Eye Distance:   Bilateral Distance:    Right Eye Near:   Left Eye Near:    Bilateral Near:     Physical Exam Vitals reviewed.  Constitutional:      General: He is not in acute distress.    Appearance: Normal appearance. He is not ill-appearing or diaphoretic.  HENT:     Head: Normocephalic and atraumatic.  Cardiovascular:     Rate and Rhythm: Normal rate and regular rhythm.     Heart sounds: Normal heart sounds.  Pulmonary:     Effort: Pulmonary effort is normal.     Breath sounds: Normal breath sounds.     Comments: Lungs are clear to auscultation. Skin:    General: Skin is warm.  Neurological:     General: No focal deficit present.     Mental Status: He is alert and oriented to person, place, and time.  Psychiatric:        Mood and Affect: Mood normal.        Behavior: Behavior normal.        Thought Content: Thought content normal.        Judgment: Judgment normal.      UC Treatments / Results  Labs (all labs ordered are listed, but only abnormal results are displayed) Labs Reviewed  GLUCOSE, POCT (MANUAL RESULT ENTRY) - Abnormal; Notable for the following components:  Result Value   POC Glucose 126 (*)    All other components within normal limits    EKG   Radiology DG Chest 2 View Result Date: 07/06/2024 CLINICAL DATA:  Cough for 1 month EXAM: CHEST - 2 VIEW COMPARISON:  07/22/2023 FINDINGS: Hardware in the cervical spine. Eventration of right diaphragm. Stable cardiomediastinal silhouette. Mild streaky left lung base opacity. No pleural effusion or pneumothorax IMPRESSION: Mild streaky left lung base opacity, atelectasis versus mild infiltrate.  Electronically Signed   By: Luke Bun M.D.   On: 07/06/2024 15:50    Procedures Procedures (including critical care time)  Medications Ordered in UC Medications - No data to display  Initial Impression / Assessment and Plan / UC Course  I have reviewed the triage vital signs and the nursing notes.  Pertinent labs & imaging results that were available during my care of the patient were reviewed by me and considered in my medical decision making (see chart for details).     Patient is a pleasant 63 y.o. male presenting with subacute cough/concern for atypical pneumonia. The patient is afebrile and nontachycardic.  Antipyretic has not been administered today. Lungs are clear to auscultation.  Type 2 diabetes - not insulin controlled. Nonfasting CBG is 126 today/  CXR: Mild streaky left lung base opacity, atelectasis versus mild infiltrate.  Will treat with prednisone  burst, and Z-Pak to cover for atypical pneumonia.  Return precautions as below.    Final Clinical Impressions(s) / UC Diagnoses   Final diagnoses:  Subacute cough     Discharge Instructions      - Your lung x-ray looks fairly normal to me, except for a small spot that I worry is turning into a bacterial infection (pneumonia) - so we will treat for this with an antibiotic and a steroid.  I will call in 1 to 2 hours if the radiologist sees something that I did not.  If you do not get a call, the x-ray was otherwise normal.   -Prednisone , 2 pills taken at the same time for 5 days in a row.  Try taking this earlier in the day as it can give you energy. Avoid NSAIDs like ibuprofen and alleve while taking this medication as they can increase your risk of stomach upset and even GI bleeding when in combination with a steroid. You can continue tylenol  (acetaminophen ) up to 1000mg  3x daily. -Azithromycin  antibiotic for 5 days -Your cough should slowly get better instead of worse. If you develop a cough productive of dark  or red sputum, new shortness of breath, new chest tightness, new fevers, etc - seek additional care. -Follow-up with your PCP on 07/22/23 as scheduled      ED Prescriptions     Medication Sig Dispense Auth. Provider   predniSONE  (DELTASONE ) 20 MG tablet Take 2 tablets (40 mg total) by mouth daily for 5 days. Take with breakfast or lunch. Avoid NSAIDs (ibuprofen, etc) while taking this medication. 10 tablet Melisssa Donner E, PA-C   azithromycin  (ZITHROMAX  Z-PAK) 250 MG tablet Take 1 tablet (250 mg total) by mouth daily. Two pills (500mg ) day 1. One pill per day (250mg ) days 2-5. 6 tablet Ladonte Verstraete E, PA-C      PDMP not reviewed this encounter.     [1]  Social History Tobacco Use   Smoking status: Never   Smokeless tobacco: Never  Substance Use Topics   Alcohol use: Yes    Comment: social   Drug use: No  Arlyss Leita BRAVO, PA-C 07/06/24 1608  "
